# Patient Record
Sex: Male | Born: 1955 | Race: White | Hispanic: No | Marital: Married | Smoking: Never smoker
Health system: Southern US, Community
[De-identification: ages and names within clinical notes are randomized; demographics above are authoritative.]

## PROBLEM LIST (undated history)

## (undated) HISTORY — PX: MELANOMA EXCISION: SHX5266

## (undated) HISTORY — PX: BASAL CELL CARCINOMA EXCISION: SHX1214

---

## 2010-06-25 ENCOUNTER — Encounter (INDEPENDENT_AMBULATORY_CARE_PROVIDER_SITE_OTHER): Payer: Self-pay | Admitting: *Deleted

## 2010-06-25 ENCOUNTER — Ambulatory Visit: Payer: Self-pay | Admitting: Internal Medicine

## 2010-06-25 DIAGNOSIS — I10 Essential (primary) hypertension: Secondary | ICD-10-CM | POA: Insufficient documentation

## 2010-06-25 DIAGNOSIS — R030 Elevated blood-pressure reading, without diagnosis of hypertension: Secondary | ICD-10-CM | POA: Insufficient documentation

## 2010-06-26 LAB — CONVERTED CEMR LAB
AST: 31 units/L (ref 0–37)
Albumin: 4.6 g/dL (ref 3.5–5.2)
Alkaline Phosphatase: 52 units/L (ref 39–117)
Basophils Relative: 0.6 % (ref 0.0–3.0)
Chloride: 100 meq/L (ref 96–112)
GFR calc non Af Amer: 74.64 mL/min (ref >60)
Hemoglobin: 14.4 g/dL (ref 13.0–17.0)
Lymphocytes Relative: 17.7 % (ref 12.0–46.0)
Monocytes Relative: 6.8 % (ref 3.0–12.0)
Neutro Abs: 4.4 10*3/uL (ref 1.4–7.7)
Phosphorus: 3.4 mg/dL (ref 2.3–4.6)
Potassium: 3.9 meq/L (ref 3.5–5.1)
RBC: 4.53 M/uL (ref 4.22–5.81)
TSH: 1.36 microintl units/mL (ref 0.35–5.50)
Total Protein: 7.7 g/dL (ref 6.0–8.3)
VLDL: 16.8 mg/dL (ref 0.0–40.0)

## 2010-07-25 ENCOUNTER — Encounter (INDEPENDENT_AMBULATORY_CARE_PROVIDER_SITE_OTHER): Payer: Self-pay

## 2010-07-29 ENCOUNTER — Ambulatory Visit: Payer: Self-pay | Admitting: Gastroenterology

## 2010-08-11 ENCOUNTER — Ambulatory Visit (HOSPITAL_COMMUNITY): Admission: RE | Admit: 2010-08-11 | Discharge: 2010-08-11 | Payer: Self-pay | Admitting: Gastroenterology

## 2010-08-11 ENCOUNTER — Ambulatory Visit: Payer: Self-pay | Admitting: Gastroenterology

## 2010-08-11 DIAGNOSIS — K56609 Unspecified intestinal obstruction, unspecified as to partial versus complete obstruction: Secondary | ICD-10-CM | POA: Insufficient documentation

## 2010-08-11 LAB — HM COLONOSCOPY

## 2010-08-19 ENCOUNTER — Ambulatory Visit: Payer: Self-pay | Admitting: Internal Medicine

## 2010-08-19 DIAGNOSIS — R7309 Other abnormal glucose: Secondary | ICD-10-CM | POA: Insufficient documentation

## 2010-08-20 LAB — CONVERTED CEMR LAB: Hgb A1c MFr Bld: 6 % (ref 4.6–6.5)

## 2010-10-28 NOTE — Letter (Signed)
Summary: [facility] Instructions  [facility]  [address] [place], [place] [address]   Phone: [phone]  Fax: [phone]       OSCEOLA DEPAZ    1956/08/12    MRN: [id number]        Procedure Day /Date:  Monday 08/11/2010     Arrival Time: 9:00 am      Procedure Time: 10:00 am     Location of Procedure:                    _x _  [facility] (4th Floor)                        PREPARATION FOR COLONOSCOPY WITH MOVIPREP   Starting 5 days prior to your procedure Wednesday 11/9 do not eat nuts, seeds, popcorn, corn, beans, peas,  salads, or any raw vegetables.  Do not take any fiber supplements (e.g. Metamucil, Citrucel, and Benefiber).  THE DAY BEFORE YOUR PROCEDURE         DATE: Sunday 11/13  1.  Drink clear liquids the entire day-NO SOLID FOOD  2.  Do not drink anything colored red or purple.  Avoid juices with pulp.  No orange juice.  3.  Drink at least 64 oz. (8 glasses) of fluid/clear liquids during the day to prevent dehydration and help the prep work efficiently.  CLEAR LIQUIDS INCLUDE: Water Jello Ice Popsicles Tea (sugar ok, no milk/cream) Powdered fruit flavored drinks Coffee (sugar ok, no milk/cream) Gatorade Juice: apple, white grape, white cranberry  Lemonade Clear bullion, consomm, broth Carbonated beverages (any kind) Strained chicken noodle soup Hard Candy                             4.  In the morning, mix first dose of MoviPrep solution:    Empty 1 Pouch A and 1 Pouch B into the disposable container    Add lukewarm drinking water to the top line of the container. Mix to dissolve    Refrigerate (mixed solution should be used within 24 hrs)  5.  Begin drinking the prep at 5:00 p.m. The MoviPrep container is divided by 4 marks.   Every 15 minutes drink the solution down to the next mark (approximately 8 oz) until the full liter is complete.   6.  Follow completed prep with 16 oz of clear liquid of your choice  (Nothing red or purple).  Continue to drink clear liquids until bedtime.  7.  Before going to bed, mix second dose of MoviPrep solution:    Empty 1 Pouch A and 1 Pouch B into the disposable container    Add lukewarm drinking water to the top line of the container. Mix to dissolve    Refrigerate  THE DAY OF YOUR PROCEDURE      DATE: Monday 11/14  Beginning at 5:00 a.m. (5 hours before procedure):         1. Every 15 minutes, drink the solution down to the next mark (approx 8 oz) until the full liter is complete.  2. Follow completed prep with 16 oz. of clear liquid of your choice.    3. You may drink clear liquids until 8:00 am (2 HOURS BEFORE PROCEDURE).   MEDICATION INSTRUCTIONS  Unless otherwise instructed, you should take regular prescription medications with a small sip of water   as early as possible the morning of  your procedure.       OTHER INSTRUCTIONS  You will need a responsible adult at least 57 years of age to accompany you and drive you home.   This person must remain in the waiting room during your procedure.  Wear loose fitting clothing that is easily removed.  Leave jewelry and other valuables at home.  However, you may wish to bring a book to read or  an iPod/MP3 player to listen to music as you wait for your procedure to start.  Remove all body piercing jewelry and leave at home.  Total time from sign-in until discharge is approximately 2-3 hours.  You should go home directly after your procedure and rest.  You can resume normal activities the  day after your procedure.  The day of your procedure you should not:   Drive   Make legal decisions   Operate machinery   Drink alcohol   Return to work  You will receive specific instructions about eating, activities and medications before you leave.    The above instructions have been reviewed and explained to me by   Ulis Rias RN  July 29, 2010 5:06 pm    I fully understand and can  verbalize these instructions _____________________________ Date _________

## 2010-10-28 NOTE — Letter (Signed)
Summary: Pre Visit Letter Revised  [facility]  [address] [place], [place] [address]   Phone: [phone]  Fax: [phone]        06/25/2010 MRN: [id number] Clarence Kirk [address] [place], [place]  [address]             Procedure Date:  08-11-10   Welcome to the Gastroenterology Division at [facility]    You are scheduled to see a nurse for your pre-procedure visit on 07-29-10 at 4:30p.m. on the 3rd floor at [facility], [address]  We ask that you try to arrive at our office 15 minutes prior to your appointment time to allow for check-in.  Please take a minute to review the attached form.  If you answer "Yes" to one or more of the questions on the first page, we ask that you call the person listed at your earliest opportunity.  If you answer "No" to all of the questions, please complete the rest of the form and bring it to your appointment.    Your nurse visit will consist of discussing your medical and surgical history, your immediate family medical history, and your medications.   If you are unable to list all of your medications on the form, please bring the medication bottles to your appointment and we will list them.  We will need to be aware of both prescribed and over the counter drugs.  We will need to know exact dosage information as well.    Please be prepared to read and sign documents such as consent forms, a financial agreement, and acknowledgement forms.  If necessary, and with your consent, a friend or relative is welcome to sit-in on the nurse visit with you.  Please bring your insurance card so that we may make a copy of it.  If your insurance requires a referral to see a specialist, please bring your referral form from your primary care physician.  No co-pay is required for this nurse visit.     If you cannot keep your appointment, please call [phone] to cancel or reschedule prior to your appointment date.  This  allows [place] the opportunity to schedule an appointment for another patient in need of care.    Thank you for choosing [facility] for your medical needs.  We appreciate the opportunity to care for you.  Please visit [place] at our website  to learn more about our practice.  Sincerely, The Gastroenterology Division

## 2010-10-28 NOTE — Procedures (Signed)
Summary: Colonoscopy  Patient: [name] Note: All result statuses are Final unless otherwise noted.  Tests: (1) Colonoscopy (COL)   COL Colonoscopy           DONE (C)     [facility]     [address]     [place], [place]  [address]           COLONOSCOPY PROCEDURE REPORT           PATIENT:  Clarence Kirk, Clarence Kirk  MR#:  [id number]     BIRTHDATE:  01/27/1956, 54 yrs. old  GENDER:  male     ENDOSCOPIST:  Vania Rea. Jarold Motto, MD, [facility]     REF. BY:  Tillman Abide, M.D.     PROCEDURE DATE:  08/11/2010     PROCEDURE:  Incomplete colonoscopy     ASA CLASS:  Class I     INDICATIONS:  screening     MEDICATIONS:   Fentanyl 100 mcg IV, Versed 10 mg IV, Benadryl 50     mg           DESCRIPTION OF PROCEDURE:   After the risks benefits and     alternatives of the procedure were thoroughly explained, informed     consent was obtained.  Digital rectal exam was performed and     revealed no abnormalities.   The LB 180AL [id number] and LB     PCF-Q180AL [id number] endoscope was introduced through the anus and     advanced to the sigmoid colon, limited by stenosis.  COULD NOT     PAASS PROXIMAL SIGMOID AREA.EVEN TRIED PEDIATRIC SCOPE.??     ANATOMICAL VARIATION.NO MASS LESION NOTED.  The quality of the     prep was excellent, using MoviPrep.  The instrument was then     slowly withdrawn as the colon was fully examined.           FINDINGS:  Mild diverticulosis was found.  no active bleeding or     blood in c.  incomplete exam.   Retroflexed views in the rectum     revealed no abnormalities.    The scope was then withdrawn from     the patient and the procedure completed.           COMPLICATIONS:  None     ENDOSCOPIC IMPRESSION:     1) Mild diverticulosis     2) No active bleeding or blood in c     3) Incomplete exam     RECOMMENDATIONS:     BARIUM ENEMA TODAY.     REPEAT EXAM:  No           ______________________________     Vania Rea. Jarold Motto, MD, [facility]           CC:           n.    REVISED:  08/11/2010 12:39 PM     eSIGNED:   Vania Rea. Patterson at 08/11/2010 12:39 PM           Annett Gula, [id number]  Note: An exclamation mark (!) indicates a result that was not dispersed into the flowsheet. Document Creation Date: 08/11/2010 12:40 PM _______________________________________________________________________  (1) Order result status: Final Collection or observation date-time: 08/11/2010 10:39 Requested date-time:  Receipt date-time:  Reported date-time:  Referring Physician:   Ordering Physician: Sheryn Bison [phone]) Specimen Source:  Source: Launa Grill Order Number: [id number] Lab site:

## 2010-10-28 NOTE — Miscellaneous (Signed)
Summary: Orders Update  Clinical Lists Changes  Problems: Added new problem of UNSPECIFIED INTESTINAL OBSTRUCTION (ICD-560.9) Orders: Added new Test order of Barium Enema (BE) - Signed

## 2010-10-28 NOTE — Assessment & Plan Note (Signed)
Summary: NEW PT TO ESTABH/DLO   Vital Signs:  Patient profile:   55 year old male Height:      66.5 inches Weight:      179 pounds BMI:     28.56 Temp:     98.4 degrees F oral Pulse rate:   96 / minute Pulse rhythm:   regular BP sitting:   180 / 100  (left arm) Cuff size:   large  Vitals Entered By: Mervin Hack CMA Duncan Dull) (June 25, 2010 11:38 AM) CC: new patient to establish care   History of Present Illness: Here to establish Has always had a phobia about doctor's offices--had a bad experience in past WIfe has insisted that he get checked  Always has white coat hypertension 125/70 when he checks it himself at home works out [employer] 6 days per week--- cardio and other cross training No changes in exercise tolerance Eats very healthy diet    Preventive Screening-Counseling & Management  Alcohol-Tobacco     Smoking Status: never  Allergies (verified): No Known Drug Allergies  Past History:  Past Medical History: Melanoma  -----------------------------------Dr Yetta Barre Basal cell carcinomas  Family History: Dad has HTN Mom with colon cancer and some stomach problems 1 sister Mat GF had CAD  No DM No prostate cancer  Social History: Occupation:  Owns Advertising account planner Married--- son and daughter Never Smoked Alcohol use-yes Occupation:  employed Smoking Status:  never  Review of Systems General:  weight up a little in the past year --has been doing some upper body weight work sleeps well wears seat belt. Eyes:  Denies double vision and vision loss-1 eye. ENT:  Denies decreased hearing and ringing in ears; teeth okay--regular with dentist. CV:  Denies chest pain or discomfort, difficulty breathing at night, difficulty breathing while lying down, fainting, lightheadness, palpitations, and shortness of breath with exertion. Resp:  Denies cough and shortness of breath. GI:  Denies abdominal pain, bloody stools, change in bowel habits, dark  tarry stools, indigestion, nausea, and vomiting. GU:  Denies erectile dysfunction, urinary frequency, and urinary hesitancy; rare nocturia. MS:  Denies joint pain and joint swelling. Derm:  Denies lesion(s) and rash; gets regular derm evals. Neuro:  Denies headaches, numbness, tingling, and weakness. Psych:  Denies anxiety and depression. Endo:  Denies cold intolerance and heat intolerance. Heme:  Denies abnormal bruising and enlarge lymph nodes. Allergy:  Denies seasonal allergies and sneezing.  Physical Exam  General:  alert and normal appearance.   Eyes:  pupils equal, pupils round, pupils reactive to light, and no optic disk abnormalities.   Ears:  R ear normal and L ear normal.   Mouth:  no erythema, no exudates, and no lesions.   Neck:  supple, no masses, no thyromegaly, no carotid bruits, and no cervical lymphadenopathy.   Lungs:  normal respiratory effort, no intercostal retractions, no accessory muscle use, and normal breath sounds.   Heart:  normal rate, regular rhythm, no murmur, and no gallop.   Abdomen:  soft, non-tender, and no masses.   Rectal:  no masses.   Small non- inflamed hemorrhoid Prostate:  no gland enlargement and no nodules.   Msk:  no joint tenderness and no joint swelling.   Pulses:  normal in feet Extremities:  no edema Neurologic:  alert & oriented X3, strength normal in all extremities, and gait normal.   Skin:  no rashes and no suspicious lesions.   Axillary Nodes:  No palpable lymphadenopathy Psych:  normally interactive, good eye  contact, not anxious appearing, and not depressed appearing.     Impression & Recommendations:  Problem # 1:  PREVENTIVE HEALTH CARE (ICD-V70.0) Assessment Comment Only healthy very fit will check PSA Colonoscopy Tdap  Problem # 2:  ELEVATED BLOOD PRESSURE WITHOUT DIAGNOSIS OF HYPERTENSION (ICD-796.2) Assessment: New  He checks and is normal at other times will just check labs and urine microal  he will  monitor  Orders: Venipuncture (04540) TLB-Lipid Panel (80061-LIPID) TLB-Renal Function Panel (80069-RENAL) TLB-CBC Platelet - w/Differential (85025-CBCD) TLB-Hepatic/Liver Function Pnl (80076-HEPATIC) TLB-TSH (Thyroid Stimulating Hormone) (84443-TSH) TLB-Microalbumin/Creat Ratio, Urine (82043-MALB)  Other Orders: TLB-PSA (Prostate Specific Antigen) (84153-PSA) Gastroenterology Referral (GI) Tdap => 33yrs IM (98119) Admin 1st Vaccine (14782) Admin 1st Vaccine Horticulturist, commercial) [phone])  Patient Instructions: 1)  Please schedule a follow-up appointment in 1-2 years  Prior Medications: None Current Allergies (reviewed today): No known allergies    Tetanus/Td Vaccine    Vaccine Type: Tdap    Site: right deltoid    Mfr: GlaxoSmithKline    Dose: 0.5 ml    Route: IM    Given by: Sydell Axon LPN    Exp. Date: 07/17/2012    Lot #: [id number]    VIS given: 08/15/08 version given June 25, 2010.

## 2010-10-28 NOTE — Miscellaneous (Signed)
Summary: Lec previsit  Clinical Lists Changes  Medications: Added new medication of MOVIPREP 100 GM  SOLR (PEG-KCL-NACL-NASULF-NA ASC-C) As per prep instructions. - Signed Rx of MOVIPREP 100 GM  SOLR (PEG-KCL-NACL-NASULF-NA ASC-C) As per prep instructions.;  #1 x 0;  Signed;  Entered by: Ulis Rias RN;  Authorized by: Mardella Layman MD [facility];  Method used: [facility]  [id number]*, [address] [place], [place]  [address], Ph: [phone] or [phone], Fax: [phone] Allergies: Added new allergy or adverse reaction of NEOSPORIN Observations: Added new observation of NKA: F (07/29/2010 16:24)    Prescriptions: MOVIPREP 100 GM  SOLR (PEG-KCL-NACL-NASULF-NA ASC-C) As per prep instructions.  #1 x 0   Entered by:   Ulis Rias RN   Authorized by:   Mardella Layman MD [facility]   Signed by:   Ulis Rias RN on 07/29/2010   Method used:   [facility] to        [facility]  [phone]* (retail)       [address] [place], [place]  [address]       Ph: [phone] or [phone]       Fax: [phone]   RxID:   [id number]

## 2015-09-25 ENCOUNTER — Ambulatory Visit: Payer: Self-pay | Admitting: Internal Medicine

## 2016-02-19 ENCOUNTER — Ambulatory Visit (INDEPENDENT_AMBULATORY_CARE_PROVIDER_SITE_OTHER): Payer: BLUE CROSS/BLUE SHIELD | Admitting: Internal Medicine

## 2016-02-19 ENCOUNTER — Encounter: Payer: Self-pay | Admitting: Internal Medicine

## 2016-02-19 ENCOUNTER — Encounter (INDEPENDENT_AMBULATORY_CARE_PROVIDER_SITE_OTHER): Payer: Self-pay

## 2016-02-19 VITALS — BP 194/110 | HR 91 | Temp 98.3°F | Ht 67.0 in | Wt 172.0 lb

## 2016-02-19 DIAGNOSIS — Z125 Encounter for screening for malignant neoplasm of prostate: Secondary | ICD-10-CM | POA: Diagnosis not present

## 2016-02-19 DIAGNOSIS — R030 Elevated blood-pressure reading, without diagnosis of hypertension: Secondary | ICD-10-CM

## 2016-02-19 DIAGNOSIS — Z0000 Encounter for general adult medical examination without abnormal findings: Secondary | ICD-10-CM

## 2016-02-19 LAB — CBC WITH DIFFERENTIAL/PLATELET
BASOS ABS: 0 10*3/uL (ref 0.0–0.1)
Basophils Relative: 0.5 % (ref 0.0–3.0)
EOS ABS: 0.1 10*3/uL (ref 0.0–0.7)
Eosinophils Relative: 0.9 % (ref 0.0–5.0)
HCT: 42.5 % (ref 39.0–52.0)
Hemoglobin: 14.3 g/dL (ref 13.0–17.0)
LYMPHS ABS: 1 10*3/uL (ref 0.7–4.0)
Lymphocytes Relative: 16.1 % (ref 12.0–46.0)
MCHC: 33.7 g/dL (ref 30.0–36.0)
MCV: 92.9 fl (ref 78.0–100.0)
MONO ABS: 0.5 10*3/uL (ref 0.1–1.0)
MONOS PCT: 7.9 % (ref 3.0–12.0)
NEUTROS ABS: 4.6 10*3/uL (ref 1.4–7.7)
NEUTROS PCT: 74.6 % (ref 43.0–77.0)
PLATELETS: 226 10*3/uL (ref 150.0–400.0)
RBC: 4.57 Mil/uL (ref 4.22–5.81)
RDW: 14.6 % (ref 11.5–15.5)
WBC: 6.2 10*3/uL (ref 4.0–10.5)

## 2016-02-19 LAB — COMPREHENSIVE METABOLIC PANEL
ALK PHOS: 42 U/L (ref 39–117)
ALT: 20 U/L (ref 0–53)
AST: 25 U/L (ref 0–37)
Albumin: 4.7 g/dL (ref 3.5–5.2)
BILIRUBIN TOTAL: 0.8 mg/dL (ref 0.2–1.2)
BUN: 18 mg/dL (ref 6–23)
CO2: 30 meq/L (ref 19–32)
Calcium: 10.1 mg/dL (ref 8.4–10.5)
Chloride: 100 mEq/L (ref 96–112)
Creatinine, Ser: 1.01 mg/dL (ref 0.40–1.50)
GFR: 79.9 mL/min (ref >60.00)
GLUCOSE: 135 mg/dL — AB (ref 70–99)
Potassium: 4.3 mEq/L (ref 3.5–5.1)
SODIUM: 138 meq/L (ref 135–145)
TOTAL PROTEIN: 7.1 g/dL (ref 6.0–8.3)

## 2016-02-19 LAB — T4, FREE: FREE T4: 0.82 ng/dL (ref 0.60–1.60)

## 2016-02-19 LAB — PSA: PSA: 0.48 ng/mL (ref 0.10–4.00)

## 2016-02-19 LAB — MICROALBUMIN / CREATININE URINE RATIO
Creatinine,U: 95.3 mg/dL
MICROALB/CREAT RATIO: 1.8 mg/g (ref 0.0–30.0)
Microalb, Ur: 1.7 mg/dL (ref 0.0–1.9)

## 2016-02-19 MED ORDER — ZOSTER VACCINE LIVE 19400 UNT/0.65ML ~~LOC~~ SUSR: 0.6500 mL | Freq: Once | SUBCUTANEOUS | Status: DC

## 2016-02-19 NOTE — Progress Notes (Signed)
Pre visit review using our clinic review tool, if applicable. No additional management support is needed unless otherwise documented below in the visit note. 

## 2016-02-19 NOTE — Assessment & Plan Note (Signed)
Healthy Will check PSA Colon due 2021 Prefers no flu shot Rx for zostavax

## 2016-02-19 NOTE — Progress Notes (Signed)
Subjective:    Patient ID: Clarence Kirk, male    DOB: 02/20/1956, 60 y.o.   MRN: [id number]  HPI Here to establish Hasn't seen any doctors in years  He has no concerns Always has had white coat hypertension He checked this morning 127/8---automated arm cuff He exercises regularly Weight is stable  Healthy other than skin cancers Sees Dr Yetta BarreJones regularly Melanoma in past Multiple BCCs  No current outpatient prescriptions on file prior to visit.   No current facility-administered medications on file prior to visit.    Allergies  Allergen Reactions  . Neomycin-Bacitracin Zn-Polymyx     REACTION: rash    No past medical history on file.  Past Surgical History  Procedure Laterality Date  . Melanoma excision  ~2009    back   . Basal cell carcinoma excision      multiple    Family History  Problem Relation Age of Onset  . Heart disease Mother   . Heart disease Maternal Grandfather   . Diabetes Neg Hx   . Cancer Neg Hx     Social History   Social History  . Marital Status: Married    Spouse Name: N/A  . Number of Children: 2  . Years of Education: N/A   Occupational History  . 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 Smoking status: Never Smoker   . Smokeless tobacco: Not on file  . Alcohol Use: 0.0 oz/week    0 Standard drinks or equivalent per week     Comment: enjoys red wine  . Drug Use: Not on file  . Sexual Activity: Not on file   Other Topics Concern  . Not on file   Social History Narrative  . No narrative on file   Review of Systems  Constitutional: Negative for fatigue and unexpected weight change.       Wears seat belt  HENT: Negative for dental problem, tinnitus and trouble swallowing.        Mild hearing problems with background noise Keeps up with dentist  Eyes: Negative for visual disturbance.  Respiratory: Negative for cough, chest tightness and shortness of breath.   Cardiovascular: Negative for chest pain,  palpitations and leg swelling.  Gastrointestinal: Negative for nausea, vomiting, abdominal pain, constipation and blood in stool.  Endocrine: Negative for polydipsia and polyuria.  Genitourinary: Negative for urgency and difficulty urinating.       No sexual problems  Musculoskeletal: Negative for back pain, joint swelling and arthralgias.  Skin: Negative for rash.  Allergic/Immunologic: Negative for environmental allergies and immunocompromised state.  Neurological: Negative for dizziness, syncope, weakness, light-headedness and headaches.  Psychiatric/Behavioral: Positive for sleep disturbance. Negative for dysphoric mood. The patient is not nervous/anxious.        Does have awakening at night--mind will race at times        Objective:   Physical Exam  Constitutional: He is oriented to person, place, and time. He appears well-developed and well-nourished. No distress.  HENT:  Head: Normocephalic and atraumatic.  Right Ear: External ear normal.  Left Ear: External ear normal.  Mouth/Throat: Oropharynx is clear and moist. No oropharyngeal exudate.  Eyes: Conjunctivae are normal. Pupils are equal, round, and reactive to light.  Neck: Normal range of motion. Neck supple. No thyromegaly present.  Cardiovascular: Normal rate, regular rhythm, normal heart sounds and intact distal pulses.  Exam reveals no gallop.   No murmur heard. Pulmonary/Chest: Effort normal and breath sounds normal. No respiratory  distress. He has no wheezes. He has no rales.  Abdominal: Soft. There is no tenderness.  Musculoskeletal: He exhibits no edema or tenderness.  Lymphadenopathy:    He has no cervical adenopathy.  Neurological: He is alert and oriented to person, place, and time.  Skin: No rash noted. No erythema.  Psychiatric: He has a normal mood and affect. His behavior is normal.          Assessment & Plan:

## 2016-02-19 NOTE — Assessment & Plan Note (Signed)
Generally fine at home, etc Asked him to check multiple sites---like at Y and pharmacy--to confirm it is usually normal

## 2016-02-26 ENCOUNTER — Other Ambulatory Visit (INDEPENDENT_AMBULATORY_CARE_PROVIDER_SITE_OTHER): Payer: BLUE CROSS/BLUE SHIELD

## 2016-02-26 DIAGNOSIS — R7309 Other abnormal glucose: Secondary | ICD-10-CM | POA: Diagnosis not present

## 2016-02-26 DIAGNOSIS — R739 Hyperglycemia, unspecified: Secondary | ICD-10-CM

## 2016-02-26 LAB — GLUCOSE, RANDOM: Glucose, Bld: 110 mg/dL — ABNORMAL HIGH (ref 70–99)

## 2017-02-05 DIAGNOSIS — H2513 Age-related nuclear cataract, bilateral: Secondary | ICD-10-CM | POA: Diagnosis not present

## 2017-10-19 ENCOUNTER — Telehealth: Payer: Self-pay | Admitting: Internal Medicine

## 2017-10-19 ENCOUNTER — Other Ambulatory Visit: Payer: Self-pay | Admitting: Internal Medicine

## 2017-10-19 DIAGNOSIS — R7309 Other abnormal glucose: Secondary | ICD-10-CM

## 2017-10-19 DIAGNOSIS — Z125 Encounter for screening for malignant neoplasm of prostate: Secondary | ICD-10-CM

## 2017-10-19 NOTE — Telephone Encounter (Signed)
Carollee HerterShannon Can this patient be worked in.  The next cpx I see is 5/2  Copied from CRM [phone]#[id number]. Topic: Appointment Scheduling - Scheduling Inquiry for Clinic >> Oct 19, 2017 11:56 AM Diana EvesHoyt, Maryann B wrote: Reason for CRM: pt would like to set up cpe and labs in March. I didn't see any slots that wasn't right behind another cpe

## 2017-10-19 NOTE — Telephone Encounter (Signed)
Do you mind if we have back to back CPE?

## 2017-10-20 NOTE — Telephone Encounter (Signed)
I have said many times that I expect back to back PE/AMW---I have too many of them not to do that. I am okay with a true open schedule

## 2017-10-20 NOTE — Telephone Encounter (Signed)
Appointment 3/11 pt aware

## 2017-10-25 ENCOUNTER — Other Ambulatory Visit: Payer: BLUE CROSS/BLUE SHIELD

## 2017-10-25 ENCOUNTER — Encounter: Payer: BLUE CROSS/BLUE SHIELD | Admitting: Internal Medicine

## 2017-12-01 ENCOUNTER — Telehealth: Payer: Self-pay | Admitting: Family Medicine

## 2017-12-01 NOTE — Telephone Encounter (Signed)
Labs already ordered by PCP.

## 2017-12-02 ENCOUNTER — Other Ambulatory Visit (INDEPENDENT_AMBULATORY_CARE_PROVIDER_SITE_OTHER): Payer: 59

## 2017-12-02 DIAGNOSIS — Z125 Encounter for screening for malignant neoplasm of prostate: Secondary | ICD-10-CM

## 2017-12-02 DIAGNOSIS — R030 Elevated blood-pressure reading, without diagnosis of hypertension: Secondary | ICD-10-CM | POA: Diagnosis not present

## 2017-12-02 DIAGNOSIS — R7309 Other abnormal glucose: Secondary | ICD-10-CM | POA: Diagnosis not present

## 2017-12-02 LAB — COMPREHENSIVE METABOLIC PANEL
ALBUMIN: 4.6 g/dL (ref 3.5–5.2)
ALT: 17 U/L (ref 0–53)
AST: 23 U/L (ref 0–37)
Alkaline Phosphatase: 46 U/L (ref 39–117)
BUN: 14 mg/dL (ref 6–23)
CHLORIDE: 99 meq/L (ref 96–112)
CO2: 29 meq/L (ref 19–32)
CREATININE: 1.05 mg/dL (ref 0.40–1.50)
Calcium: 10 mg/dL (ref 8.4–10.5)
GFR: 75.95 mL/min (ref >60.00)
GLUCOSE: 111 mg/dL — AB (ref 70–99)
POTASSIUM: 4 meq/L (ref 3.5–5.1)
SODIUM: 137 meq/L (ref 135–145)
Total Bilirubin: 0.9 mg/dL (ref 0.2–1.2)
Total Protein: 7.4 g/dL (ref 6.0–8.3)

## 2017-12-02 LAB — LIPID PANEL
CHOL/HDL RATIO: 3
Cholesterol: 222 mg/dL — ABNORMAL HIGH (ref 0–200)
HDL: 82 mg/dL (ref >39.00)
LDL CALC: 122 mg/dL — AB (ref 0–99)
NONHDL: 140.13
Triglycerides: 90 mg/dL (ref 0.0–149.0)
VLDL: 18 mg/dL (ref 0.0–40.0)

## 2017-12-02 LAB — CBC
HCT: 42.5 % (ref 39.0–52.0)
HEMOGLOBIN: 14.6 g/dL (ref 13.0–17.0)
MCHC: 34.3 g/dL (ref 30.0–36.0)
MCV: 95.3 fl (ref 78.0–100.0)
PLATELETS: 247 10*3/uL (ref 150.0–400.0)
RBC: 4.46 Mil/uL (ref 4.22–5.81)
RDW: 13.8 % (ref 11.5–15.5)
WBC: 5.3 10*3/uL (ref 4.0–10.5)

## 2017-12-02 LAB — PSA: PSA: 0.55 ng/mL (ref 0.10–4.00)

## 2017-12-02 LAB — HEMOGLOBIN A1C: HEMOGLOBIN A1C: 5.9 % (ref 4.6–6.5)

## 2017-12-06 ENCOUNTER — Other Ambulatory Visit: Payer: BLUE CROSS/BLUE SHIELD

## 2017-12-06 ENCOUNTER — Encounter: Payer: Self-pay | Admitting: Internal Medicine

## 2017-12-06 ENCOUNTER — Ambulatory Visit (INDEPENDENT_AMBULATORY_CARE_PROVIDER_SITE_OTHER): Payer: 59 | Admitting: Internal Medicine

## 2017-12-06 VITALS — BP 240/90 | HR 107 | Temp 98.1°F | Ht 67.0 in | Wt 177.0 lb

## 2017-12-06 DIAGNOSIS — Z0001 Encounter for general adult medical examination with abnormal findings: Secondary | ICD-10-CM | POA: Diagnosis not present

## 2017-12-06 DIAGNOSIS — R011 Cardiac murmur, unspecified: Secondary | ICD-10-CM | POA: Diagnosis not present

## 2017-12-06 DIAGNOSIS — Z0000 Encounter for general adult medical examination without abnormal findings: Secondary | ICD-10-CM

## 2017-12-06 DIAGNOSIS — R030 Elevated blood-pressure reading, without diagnosis of hypertension: Secondary | ICD-10-CM | POA: Diagnosis not present

## 2017-12-06 LAB — MICROALBUMIN / CREATININE URINE RATIO
CREATININE, U: 247.6 mg/dL
MICROALB UR: 5.9 mg/dL — AB (ref 0.0–1.9)
Microalb Creat Ratio: 2.4 mg/g (ref 0.0–30.0)

## 2017-12-06 NOTE — Assessment & Plan Note (Signed)
Healthy Colon due 2021 PSA just checked  Stays fit Unusual cognitive issues--but doesn't seem pre dementia

## 2017-12-06 NOTE — Assessment & Plan Note (Signed)
BP Readings from Last 3 Encounters:  12/06/17 (!) 240/90  02/19/16 (!) 194/110   Always very high  Never high at home with regular tests Will check urine microal and echo (due to murmur) and to make sure no wall thickening, etc

## 2017-12-06 NOTE — Progress Notes (Signed)
Subjective:    Patient ID: Clarence Kirk, male    DOB: 04/08/1956, 62 y.o.   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 Glucosamine-Chondroitin (GLUCOSAMINE CHONDR COMPLEX PO) Take 1 tablet by mouth.    . Multiple Vitamin (MULTIVITAMIN) capsule Take 1 capsule by mouth daily.    . Omega-3 Fatty Acids (FISH OIL) 1000 MG CAPS Take 1 capsule by mouth.     No current facility-administered medications on file prior to visit.     Allergies  Allergen Reactions  . Neomycin-Bacitracin Zn-Polymyx     REACTION: rash    History reviewed. No pertinent past medical history.  Past Surgical History:  Procedure Laterality Date  . BASAL CELL CARCINOMA EXCISION     multiple  . MELANOMA EXCISION  ~2009   back     Family History  Problem Relation Age of Onset  . Heart disease Mother   . Heart disease Maternal Grandfather   . Diabetes Neg Hx   . Cancer Neg Hx     Social History   Socioeconomic History  . Marital status: Married    Spouse name: Not on file  . Number of children: 2  . Years of education: Not on file  . Highest education level: Not on file  Social Needs  . Financial resource strain: Not on file  . Food insecurity - worry: Not on file  . Food insecurity - inability: Not on file  . Transportation  needs - medical: Not on file  . Transportation needs - non-medical: Not on file  Occupational History  . 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 Smoking status: Never Smoker  . Smokeless tobacco: Never Used  Substance and Sexual Activity  . Alcohol use: Yes    Alcohol/week: 0.0 oz    Comment: enjoys red wine  . Drug use: Not on file  . Sexual activity: Not on file  Other Topics Concern  . Not on file  Social History Narrative  . Not on file   Review of Systems  Constitutional: Negative for fatigue and unexpected weight change.       Wears seat belt  HENT: Negative for dental problem, hearing loss and tinnitus.        Keeps up with dentist  Eyes: Negative for visual disturbance.       No diplopia or unilateral vision loss  Respiratory: Negative for cough, chest tightness and shortness of breath.   Cardiovascular: Negative for chest pain, palpitations and leg swelling.  Gastrointestinal: Negative for blood in stool, constipation and nausea.       No heartburn  Endocrine: Negative for polydipsia and polyuria.  Musculoskeletal: Negative for arthralgias, back pain and joint swelling.  Skin: Negative for rash.       Past skin cancers--and in family. Sees Dr Yetta BarreJones  regularly  Allergic/Immunologic: Negative for environmental allergies and immunocompromised state.  Neurological: Negative for dizziness, syncope, light-headedness and headaches.  Hematological: Negative for adenopathy. Does not bruise/bleed easily.  Psychiatric/Behavioral: Positive for sleep disturbance. Negative for dysphoric mood. The patient is nervous/anxious.        Objective:   Physical Exam  Constitutional: He appears well-developed and well-nourished. No distress.  HENT:  Head: Normocephalic and atraumatic.  Right Ear: External ear normal.  Left Ear: External ear normal.  Mouth/Throat: No oropharyngeal exudate.  Eyes: Conjunctivae are normal. Pupils are equal, round, and reactive to light.  Neck:  No thyromegaly present.  Cardiovascular: Normal rate, regular rhythm and intact distal pulses. Exam reveals no gallop.  Soft systolic murmur at base  Pulmonary/Chest: Effort normal and breath sounds normal. No respiratory distress. He has no wheezes. He has no rales.  Abdominal: Soft. He exhibits no distension. There is no tenderness. There is no rebound and no guarding.  Musculoskeletal: He exhibits no edema or tenderness.  Lymphadenopathy:    He has no cervical adenopathy.  Neurological:  No true tremor. Quivering in feet/abdomen ---due to his nerves  Skin: No rash noted. No erythema.  Psychiatric: He has a normal mood and affect. His behavior is normal.          Assessment & Plan:

## 2017-12-23 ENCOUNTER — Other Ambulatory Visit: Payer: 59

## 2018-01-06 ENCOUNTER — Ambulatory Visit (INDEPENDENT_AMBULATORY_CARE_PROVIDER_SITE_OTHER): Payer: 59

## 2018-01-06 ENCOUNTER — Other Ambulatory Visit: Payer: Self-pay

## 2018-01-06 DIAGNOSIS — R011 Cardiac murmur, unspecified: Secondary | ICD-10-CM

## 2018-01-19 ENCOUNTER — Telehealth: Payer: Self-pay | Admitting: Cardiovascular Disease

## 2018-01-19 NOTE — Telephone Encounter (Signed)
Reviewed with patient that results went to Dr. Karle StarchLetvak's office but preliminary results reviewed with him. He verbalized understanding and was appreciative for the call back with no further questions at this time.

## 2018-01-19 NOTE — Telephone Encounter (Signed)
Patient calling Had an ECHO on 4/11 and would like to check on status of when results would be ready Please call

## 2018-02-10 DIAGNOSIS — H2513 Age-related nuclear cataract, bilateral: Secondary | ICD-10-CM | POA: Diagnosis not present

## 2018-12-12 ENCOUNTER — Encounter: Payer: 59 | Admitting: Internal Medicine

## 2019-02-13 DIAGNOSIS — H2513 Age-related nuclear cataract, bilateral: Secondary | ICD-10-CM | POA: Diagnosis not present

## 2019-11-19 ENCOUNTER — Ambulatory Visit: Payer: BC Managed Care – PPO | Attending: Internal Medicine

## 2019-11-19 DIAGNOSIS — Z23 Encounter for immunization: Secondary | ICD-10-CM | POA: Insufficient documentation

## 2019-11-19 NOTE — Progress Notes (Signed)
   Covid-19 Vaccination Clinic  Name:  Clarence Kirk    MRN: [id number] DOB: 09/26/1956  11/19/2019  Clarence Kirk was observed post Covid-19 immunization for 15 minutes without incidence. He was provided with Vaccine Information Sheet and instruction to access the V-Safe system.   Clarence Kirk was instructed to call 911 with any severe reactions post vaccine: Marland Kitchen Difficulty breathing  . Swelling of your face and throat  . A fast heartbeat  . A bad rash all over your body  . Dizziness and weakness    Immunizations Administered    Name Date Dose VIS Date Route   Pfizer COVID-19 Vaccine 11/19/2019  8:33 AM 0.3 mL 09/08/2019 Intramuscular   Manufacturer: [employer], [employer]   Lot: [id number]   NDC: [address]-8

## 2019-12-12 ENCOUNTER — Ambulatory Visit: Payer: BC Managed Care – PPO | Attending: Internal Medicine

## 2019-12-12 DIAGNOSIS — Z23 Encounter for immunization: Secondary | ICD-10-CM

## 2019-12-12 NOTE — Progress Notes (Signed)
   Covid-19 Vaccination Clinic  Name:  Clarence Kirk    MRN: [id number] DOB: Apr 25, 1956  12/12/2019  Clarence Kirk was observed post Covid-19 immunization for 15 minutes without incident. He was provided with Vaccine Information Sheet and instruction to access the V-Safe system.   Clarence Kirk was instructed to call 911 with any severe reactions post vaccine: Marland Kitchen Difficulty breathing  . Swelling of face and throat  . A fast heartbeat  . A bad rash all over body  . Dizziness and weakness   Immunizations Administered    Name Date Dose VIS Date Route   Pfizer COVID-19 Vaccine 12/12/2019  3:00 PM 0.3 mL 09/08/2019 Intramuscular   Manufacturer: [employer], [employer]   Lot: [id number]   NDC: [address]-0

## 2021-07-09 ENCOUNTER — Telehealth: Payer: Self-pay

## 2021-07-09 NOTE — Telephone Encounter (Signed)
Unable to reach pt by phone; I spoke with pts wife (DPR not signed to give info) I did not give info but pts wife said since spoke with access nurse pt went to [facility] on [address] and received abx and cough med. Mrs Foti said nothing else needed. Pt last saw Dr Alphonsus Sias on 12/06/2017. No future appt scheduled. Sending note to Dr Alphonsus Sias who is out of office and Dr Selena Batten who is in office and [facility] CMA.

## 2021-07-09 NOTE — Telephone Encounter (Signed)
[facility] - Client TELEPHONE ADVICE RECORD AccessNurse Patient Name: MAHONRI SEIDEN Gender: Male DOB: 13-Sep-1956 Age: 65 Y 29 D Return Phone Number: [phone] (Primary) Address: City/ State/ Zip: [place]  [address] Client [facility] - Client Client Site [facility] - Day Physician Tillman Abide- MD Contact Type Call Who Is Calling Patient / Member / Family / Caregiver Call Type Triage / Clinical Caller Name Joyice Faster Relationship To Patient Other Return Phone Number [phone] (Primary) Chief Complaint BREATHING - shortness of breath or sounds breathless Reason for Call Symptomatic / Request for Health Information Initial Comment Caller states, husband had COVID, tested negative. Has a terrible cough and gasping for Breath. Veronica from office called. Translation No Nurse Assessment Nurse: Thurmond Butts, RN, Meriam Sprague Date/Time (Eastern Time): 07/09/2021 8:37:27 AM Confirm and document reason for call. If symptomatic, describe symptoms. ---Caller states her husband has had covid but is negative now. He is gasping for breath at times. He is not gasping for breath now. this has been going on for 2 weeks. He is having coughing fits. Does the patient have any new or worsening symptoms? ---Yes Will a triage be completed? ---Yes Related visit to physician within the last 2 weeks? ---No Does the PT have any chronic conditions? (i.e. diabetes, asthma, this includes High risk factors for pregnancy, etc.) ---No Is this a behavioral health or substance abuse call? ---No Disp. Time Lamount Cohen Time) Disposition Final User 07/09/2021 8:36:33 AM Send to Urgent Marlana Salvage 07/09/2021 8:50:37 AM Clinical Call Yes Thurmond Butts, RN, Meriam Sprague Comments User: Bradly Chris, RN Date/Time Lamount Cohen Time): 07/09/2021 8:50:26 AM PLEASE NOTE: All timestamps contained within this report are represented as  [place] Standard Time. CONFIDENTIALTY NOTICE: This fax transmission is intended only for the addressee. It contains information that is legally privileged, confidential or otherwise protected from use or disclosure. If you are not the intended recipient, you are strictly prohibited from reviewing, disclosing, copying using or disseminating any of this information or taking any action in reliance on or regarding this information. If you have received this fax in error, please notify us immediately by telephone so that we can arrange for its return to [place]. Phone: [phone], Toll-Free: [phone], Fax: [phone] Page: 2 of 2 Call Id: [id number] Comments She is not with her husband. He is at work. I have tried to call him twice. He does not answer and she has texted him to tell him i am calling. He did not answer. Told her i am unable to triage without him being there, but if she feels he is needing immediate assistance call 911 or go to ER . She says she just wants appointment at office. Told her i am unable to make appointment without triage, but she can of course call back if with him or he can call back

## 2021-07-10 NOTE — Telephone Encounter (Signed)
Left message to call office or send MyChart note to let us know how he is doing.

## 2021-07-14 ENCOUNTER — Encounter (HOSPITAL_COMMUNITY): Payer: Self-pay | Admitting: Emergency Medicine

## 2021-07-14 ENCOUNTER — Emergency Department (HOSPITAL_COMMUNITY)
Admission: EM | Admit: 2021-07-14 | Discharge: 2021-07-15 | Disposition: A | Discharge status: Home / Self Care | Payer: BC Managed Care – PPO | Attending: Emergency Medicine | Admitting: Emergency Medicine

## 2021-07-14 ENCOUNTER — Other Ambulatory Visit: Payer: Self-pay

## 2021-07-14 ENCOUNTER — Emergency Department (HOSPITAL_COMMUNITY): Payer: BC Managed Care – PPO

## 2021-07-14 DIAGNOSIS — S6992XA Unspecified injury of left wrist, hand and finger(s), initial encounter: Secondary | ICD-10-CM | POA: Diagnosis present

## 2021-07-14 DIAGNOSIS — Z8616 Personal history of COVID-19: Secondary | ICD-10-CM | POA: Diagnosis not present

## 2021-07-14 DIAGNOSIS — R55 Syncope and collapse: Secondary | ICD-10-CM

## 2021-07-14 DIAGNOSIS — R410 Disorientation, unspecified: Secondary | ICD-10-CM | POA: Diagnosis not present

## 2021-07-14 DIAGNOSIS — Y92000 Kitchen of unspecified non-institutional (private) residence as  the place of occurrence of the external cause: Secondary | ICD-10-CM | POA: Insufficient documentation

## 2021-07-14 DIAGNOSIS — Z23 Encounter for immunization: Secondary | ICD-10-CM | POA: Diagnosis not present

## 2021-07-14 DIAGNOSIS — W19XXXA Unspecified fall, initial encounter: Secondary | ICD-10-CM | POA: Diagnosis not present

## 2021-07-14 DIAGNOSIS — S0083XA Contusion of other part of head, initial encounter: Secondary | ICD-10-CM | POA: Insufficient documentation

## 2021-07-14 DIAGNOSIS — S63125A Dislocation of unspecified interphalangeal joint of left thumb, initial encounter: Secondary | ICD-10-CM | POA: Diagnosis not present

## 2021-07-14 DIAGNOSIS — R059 Cough, unspecified: Secondary | ICD-10-CM | POA: Diagnosis not present

## 2021-07-14 DIAGNOSIS — I7121 Aneurysm of the ascending aorta, without rupture: Secondary | ICD-10-CM

## 2021-07-14 DIAGNOSIS — I2510 Atherosclerotic heart disease of native coronary artery without angina pectoris: Secondary | ICD-10-CM | POA: Diagnosis not present

## 2021-07-14 DIAGNOSIS — S01511A Laceration without foreign body of lip, initial encounter: Secondary | ICD-10-CM | POA: Diagnosis not present

## 2021-07-14 DIAGNOSIS — R413 Other amnesia: Secondary | ICD-10-CM | POA: Diagnosis not present

## 2021-07-14 LAB — CBC WITH DIFFERENTIAL/PLATELET
Abs Immature Granulocytes: 0.07 10*3/uL (ref 0.00–0.07)
Basophils Absolute: 0 10*3/uL (ref 0.0–0.1)
Basophils Relative: 0 %
Eosinophils Absolute: 0.1 10*3/uL (ref 0.0–0.5)
Eosinophils Relative: 1 %
HCT: 38.7 % — ABNORMAL LOW (ref 39.0–52.0)
Hemoglobin: 13.3 g/dL (ref 13.0–17.0)
Immature Granulocytes: 1 %
Lymphocytes Relative: 15 %
Lymphs Abs: 1.3 10*3/uL (ref 0.7–4.0)
MCH: 32.6 pg (ref 26.0–34.0)
MCHC: 34.4 g/dL (ref 30.0–36.0)
MCV: 94.9 fL (ref 80.0–100.0)
Monocytes Absolute: 0.7 10*3/uL (ref 0.1–1.0)
Monocytes Relative: 8 %
Neutro Abs: 6.6 10*3/uL (ref 1.7–7.7)
Neutrophils Relative %: 75 %
Platelets: 265 10*3/uL (ref 150–400)
RBC: 4.08 MIL/uL — ABNORMAL LOW (ref 4.22–5.81)
RDW: 11.9 % (ref 11.5–15.5)
WBC: 8.8 10*3/uL (ref 4.0–10.5)
nRBC: 0 % (ref 0.0–0.2)

## 2021-07-14 LAB — COMPREHENSIVE METABOLIC PANEL
ALT: 18 U/L (ref 0–44)
AST: 28 U/L (ref 15–41)
Albumin: 3.7 g/dL (ref 3.5–5.0)
Alkaline Phosphatase: 47 U/L (ref 38–126)
Anion gap: 13 (ref 5–15)
BUN: 20 mg/dL (ref 8–23)
CO2: 23 mmol/L (ref 22–32)
Calcium: 9.5 mg/dL (ref 8.9–10.3)
Chloride: 101 mmol/L (ref 98–111)
Creatinine, Ser: 1.06 mg/dL (ref 0.61–1.24)
GFR, Estimated: >60 mL/min (ref >60)
Glucose, Bld: 115 mg/dL — ABNORMAL HIGH (ref 70–99)
Potassium: 3.7 mmol/L (ref 3.5–5.1)
Sodium: 137 mmol/L (ref 135–145)
Total Bilirubin: 0.4 mg/dL (ref 0.3–1.2)
Total Protein: 7 g/dL (ref 6.5–8.1)

## 2021-07-14 LAB — TROPONIN I (HIGH SENSITIVITY): Troponin I (High Sensitivity): 13 ng/L (ref <18)

## 2021-07-14 NOTE — ED Triage Notes (Addendum)
Per EMS, pt from home, had a unwitnessed syncopal episode w/ a fall that he does not remember.  Pts wife stated he was unresponsive for a minute.  AO X4, stroke screen negative. Hypertensive 202/96.  Repetitive questions, does not remember episode.  No medical hx. No blood thinners  CBG 139 HR 104 98% RA  Denies pain other than lip which does have a wound.

## 2021-07-14 NOTE — ED Provider Notes (Signed)
Emergency Medicine Provider Triage Evaluation Note  Clarence Kirk , a 65 y.o. male  was evaluated in triage.  Pt complains of unwitnessed fall with LOC.  Patient does not remember anything leading up to the fall and EMS suspected that he may have had a syncopal episode.  He denies any current chest pain, headache, visual changes or other complaints at this time.  Had repetitive questioning en route with EMS  Review of Systems  Positive: Fall, loss of consciousness Negative: Chest pain  Physical Exam  There were no vitals taken for this visit. Gen:   Awake, no distress   Resp:  Normal effort  MSK:   Moves extremities without difficulty  Other:  Cn II-XII intact. 5/5 strength to the bue/ble. Jagged laceration to the right lower lip  Medical Decision Making  Medically screening exam initiated at 9:23 PM.  Appropriate orders placed.  Clarence Kirk was informed that the remainder of the evaluation will be completed by another provider, this initial triage assessment does not replace that evaluation, and the importance of remaining in the ED until their evaluation is complete.     Clarence Kirk 07/14/21 2130    Terrilee Files, MD 07/15/21 1002

## 2021-07-15 ENCOUNTER — Emergency Department (HOSPITAL_COMMUNITY): Payer: BC Managed Care – PPO

## 2021-07-15 LAB — TROPONIN I (HIGH SENSITIVITY): Troponin I (High Sensitivity): 15 ng/L (ref <18)

## 2021-07-15 MED ORDER — HYDRALAZINE HCL 25 MG PO TABS: 25.0000 mg | ORAL_TABLET | Freq: Once | ORAL | Status: DC

## 2021-07-15 MED ORDER — LIDOCAINE HCL (PF) 1 % IJ SOLN
10.0000 mL | Freq: Once | INTRAMUSCULAR | Status: DC
  Filled 2021-07-15: qty 10

## 2021-07-15 MED ORDER — TETANUS-DIPHTH-ACELL PERTUSSIS 5-2.5-18.5 LF-MCG/0.5 IM SUSY
0.5000 mL | PREFILLED_SYRINGE | Freq: Once | INTRAMUSCULAR | Status: AC
  Administered 2021-07-15: 0.5 mL via INTRAMUSCULAR
  Filled 2021-07-15: qty 0.5

## 2021-07-15 MED ORDER — AMLODIPINE BESYLATE 5 MG PO TABS: 5.0000 mg | ORAL_TABLET | Freq: Every day | ORAL | 0 refills | Status: DC

## 2021-07-15 MED ORDER — AMLODIPINE BESYLATE 5 MG PO TABS
5.0000 mg | ORAL_TABLET | Freq: Once | ORAL | Status: AC
  Administered 2021-07-15: 5 mg via ORAL
  Filled 2021-07-15: qty 1

## 2021-07-15 MED ORDER — IOHEXOL 350 MG/ML SOLN
50.0000 mL | Freq: Once | INTRAVENOUS | Status: AC | PRN
  Administered 2021-07-15: 50 mL via INTRAVENOUS

## 2021-07-15 NOTE — ED Provider Notes (Signed)
  Face-to-face evaluation   History: He presents for evaluation of an episode of syncope that occurred yesterday.  He had a prolonged ED waiting room delay.  He remembers coughing prior to the syncopal episode.  Patient's wife heard him fall and found him unconscious, for 2 minutes.  After that he was able ambulate.  He complains of pain in his chin, his lips, his left arm.  He has chronic known hypertension for which he is trying to treat with exercise and diet.  He is recovering from COVID, onset of symptoms about 3 weeks ago.  Physical exam: Alert, calm, cooperative.  No dysarthria or aphasia.  Right lower lip has a gaping laceration about 1.5 cm in length.  There is no trismus.  No visible dental injury.  No through and through laceration.  No respiratory distress  Medical screening examination/treatment/procedure(s) were conducted as a shared visit with non-physician practitioner(s) and myself.  I personally evaluated the patient during the encounter    Mancel Bale, MD 07/15/21 1946

## 2021-07-15 NOTE — Discharge Instructions (Addendum)
Lip injury: Please monitor this for any signs of infection including redness, increased swelling, heat, pus drainage.  Please follow-up immediately if you notice any of these signs or symptoms.  You may have a scar on your lower lip, but should not have any long-term numbness, or other difficulty.  Thumb: Please keep your left thumb in the splint for the next 2 to 3 weeks, or until your primary care orthopedic doctor instructs you.  After around 2 or 3 weeks you can begin gently stretching and moving the joint.  You can take ibuprofen, Tylenol as needed for pain control.  Aortic aneurysm: As we discussed you have a slight dilation of your thoracic aorta, the official recommendation from the American Heart Association is to follow-up for repeat imaging on an annual basis.  Blood pressure: Please begin taking the amlodipine 5 mg daily as prescribed.  Continue to monitor your blood pressure at home.  Encourage you to continue a healthy diet, low-sodium diet.  Please follow-up with your primary care at your earliest convenience for further evaluation.

## 2021-07-15 NOTE — ED Notes (Signed)
Pt verbalized understanding of d/c instructions, meds, and followup care. Denies questions. VSS, no distress noted. Still very hypertensive, provider made aware and deemed okay for discharge.Steady gait to exit with all belongings.

## 2021-07-15 NOTE — Progress Notes (Signed)
Orthopedic Tech Progress Note Patient Details:  Clarence Kirk [birth year] [id number]  Ortho Devices Type of Ortho Device: Finger splint Ortho Device/Splint Location: LUE Ortho Device/Splint Interventions: Ordered, Application   Post Interventions Patient Tolerated: Well Instructions Provided: Care of device  Donald Pore 07/15/2021, 5:43 PM

## 2021-07-15 NOTE — Telephone Encounter (Signed)
I left another message for pt. I see that he is in the ER. Will forward to Dr Alphonsus Sias as Lorain Childes.

## 2021-07-15 NOTE — ED Notes (Signed)
Lidocaine and suture cart at door. ?

## 2021-07-15 NOTE — ED Provider Notes (Signed)
[facility] EMERGENCY DEPARTMENT Provider Note   CSN: [id number] Arrival date & time: 07/14/21  2117     History Chief Complaint  Patient presents with   Loss of Consciousness    Clarence Kirk is a 65 y.o. male with a past medical history significant for strong family history of coronary artery disease, stroke, personal history of hypertension, and recent COVID infection within the last 2 weeks who presents after a syncopal episode last night.  Patient reports that he remembers having a coughing fit, standing in the kitchen, when he passed out, the next thing he remembers was waking up with his wife and firefighters surrounding him before getting in the EMS vehicle and being brought to the hospital.  His wife reports that he was standing in the kitchen, doing nothing when suddenly she heard a thump, went to see the patient on the ground and he was awake, with dilated pupils, a cut on his right lower lip, injury to his right chin, patient was confused, did not remember the fall.  Patient was alert and oriented x3 once in the EMS, however he has maintained amnesia of the syncopal episode.  Patient has not had similar syncopal episodes in the past, patient denies recent travel, patient is not taking a blood thinner, patient is not take anything for his blood pressure other than natural remedies.  Patient does not recall when his last tetanus shot was. Patient reports injury to left thumb as well.   Loss of Consciousness     History reviewed. No pertinent past medical history.  Patient Active Problem List   Diagnosis Date Noted   Preventative health care 02/19/2016   OTHER ABNORMAL GLUCOSE 08/19/2010   ELEVATED BLOOD PRESSURE WITHOUT DIAGNOSIS OF HYPERTENSION 06/25/2010    Past Surgical History:  Procedure Laterality Date   BASAL CELL CARCINOMA EXCISION     multiple   MELANOMA EXCISION  ~2009   back        Family History  Problem Relation Age of Onset   Heart  disease Mother    Alzheimer's disease Mother    Heart disease Maternal Grandfather    Diabetes Neg Hx    Cancer Neg Hx     Social History   Tobacco Use   Smoking status: Never   Smokeless tobacco: Never  Substance Use Topics   Alcohol use: Yes    Alcohol/week: 0.0 standard drinks    Comment: enjoys red wine    Home Medications Prior to Admission medications   Medication Sig Start Date End Date Taking? Authorizing Provider  amLODipine (NORVASC) 5 MG tablet Take 1 tablet (5 mg total) by mouth daily. 07/15/21  Yes Jaedynn Bohlken H, PA-C  Multiple Vitamin (MULTIVITAMIN) capsule Take 1 capsule by mouth daily.   Yes [provider]  Omega-3 Fatty Acids (FISH OIL) 1000 MG CAPS Take 1 capsule by mouth.   Yes [provider]  OVER THE COUNTER MEDICATION Take 2 Scoops by mouth daily. Beet Powder supplement   Yes [provider]  OVER THE COUNTER MEDICATION Take 2 tablets by mouth daily. Sambucol elderberry gummies   Yes [provider]  promethazine-dextromethorphan (PROMETHAZINE-DM) 6.25-15 MG/5ML syrup Take by mouth. 07/09/21 07/16/21 Yes [provider]    Allergies    Neomycin-bacitracin zn-polymyx and Tape  Review of Systems   Review of Systems  Respiratory:  Positive for cough.   Cardiovascular:  Positive for syncope.  All other systems reviewed and are negative.  Physical Exam Updated  Vital Signs BP (!) 201/91   Pulse 70   Temp 98.2 F (36.8 C) (Oral)   Resp 17   Ht 5\' 9"  (1.753 m)   Wt 80.7 kg   SpO2 98%   BMI 26.29 kg/m   Physical Exam Vitals and nursing note reviewed.  Constitutional:      General: He is not in acute distress.    Appearance: Normal appearance.  HENT:     Head: Normocephalic.     Comments: Bruising noted on the right chin, there is a small perhaps 1 cm laceration with some crusting, and swelling on the right lower lip, the laceration does seem to involve the vermilion border.  Is not actively  bleeding at this time. Eyes:     General:        Right eye: No discharge.        Left eye: No discharge.  Cardiovascular:     Rate and Rhythm: Normal rate and regular rhythm.     Pulses: Normal pulses.     Heart sounds: No murmur heard.   No friction rub. No gallop.  Pulmonary:     Effort: Pulmonary effort is normal.     Breath sounds: Normal breath sounds.  Abdominal:     General: Bowel sounds are normal.     Palpations: Abdomen is soft.  Musculoskeletal:     Comments: Bruising, injury noted to left thumb patient is able to oppose thumb without pain, however he has tenderness to palpation, some difficulty with flexion.  Distal capillary refill intact.  Skin:    General: Skin is warm and dry.     Capillary Refill: Capillary refill takes less than 2 seconds.  Neurological:     Mental Status: He is alert and oriented to person, place, and time.  Psychiatric:        Mood and Affect: Mood normal.        Behavior: Behavior normal.    ED Results / Procedures / Treatments   Labs (all labs ordered are listed, but only abnormal results are displayed) Labs Reviewed  CBC WITH DIFFERENTIAL/PLATELET - Abnormal; Notable for the following components:      Result Value   RBC 4.08 (*)    HCT 38.7 (*)    All other components within normal limits  COMPREHENSIVE METABOLIC PANEL - Abnormal; Notable for the following components:   Glucose, Bld 115 (*)    All other components within normal limits  TROPONIN I (HIGH SENSITIVITY)  TROPONIN I (HIGH SENSITIVITY)    EKG EKG Interpretation  Date/Time:  Monday July 14 2021 21:25:13 EDT Ventricular Rate:  101 PR Interval:  134 QRS Duration: 96 QT Interval:  356 QTC Calculation: 461 R Axis:   -75 Text Interpretation: Sinus tachycardia Incomplete right bundle branch block Left anterior fascicular block Minimal voltage criteria for LVH, may be normal variant ( Cornell product ) Cannot rule out Anterior infarct , age undetermined Abnormal ECG  Confirmed by [id number] [phone]) on 07/15/2021 12:47:56 AM  Radiology CT Head Wo Contrast  Result Date: 07/14/2021 CLINICAL DATA:  Syncope, fall EXAM: CT HEAD WITHOUT CONTRAST TECHNIQUE: Contiguous axial images were obtained from the base of the skull through the vertex without intravenous contrast. COMPARISON:  None. FINDINGS: Brain: No evidence of acute infarction, hemorrhage, hydrocephalus, extra-axial collection or mass lesion/mass effect. Subcortical white matter and periventricular small vessel ischemic changes. Vascular: Intracranial atherosclerosis. Skull: Normal. Negative for fracture or focal lesion. Sinuses/Orbits: The visualized paranasal sinuses are essentially clear. The  mastoid air cells are unopacified. Other: None. IMPRESSION: No evidence of acute intracranial abnormality. Small vessel ischemic changes. Electronically Signed   By: Charline Bills M.D.   On: 07/14/2021 22:17   CT Angio Chest PE W and/or Wo Contrast  Result Date: 07/15/2021 CLINICAL DATA:  PE suspected, high prob EXAM: CT ANGIOGRAPHY CHEST WITH CONTRAST TECHNIQUE: Multidetector CT imaging of the chest was performed using the standard protocol during bolus administration of intravenous contrast. Multiplanar CT image reconstructions and MIPs were obtained to evaluate the vascular anatomy. CONTRAST:  29mL OMNIPAQUE IOHEXOL 350 MG/ML SOLN COMPARISON:  None. FINDINGS: Cardiovascular: Limited evaluation of the lung bases secondary to respiratory motion. Satisfactory opacification of the pulmonary arteries to the segmental level. No evidence of pulmonary embolism. Mild cardiomegaly. No pericardial effusion. Atherosclerotic calcifications of the aorta, mild. Aneurysmal dilation of the ascending thoracic aorta 4.4 x 4.2 cm. LEFT-sided coronary artery atherosclerotic calcifications. Mediastinum/Nodes: Visualized thyroid is unremarkable. No axillary or mediastinal adenopathy. Lungs/Pleura: Scattered bibasilar atelectasis. No  pleural effusion or pneumothorax. Upper Abdomen: No acute abnormality. Musculoskeletal: No chest wall abnormality. No acute or significant osseous findings. Review of the MIP images confirms the above findings. IMPRESSION: 1. No acute pulmonary embolism. 2. Aneurysmal dilation of the ascending thoracic aorta to 4.4 x 4.2 cm. Recommend annual imaging followup by CTA or MRA. This recommendation follows 2010 ACCF/AHA/AATS/ACR/ASA/SCA/SCAI/SIR/STS/SVM Guidelines for the Diagnosis and Management of Patients with Thoracic Aortic Disease. Circulation. 2010; 121: W409-B353. Aortic aneurysm NOS (ICD10-I71.9) Aortic Atherosclerosis (ICD10-I70.0). Electronically Signed   By: Meda Klinefelter M.D.   On: 07/15/2021 16:01   DG Finger Thumb Left  Result Date: 07/15/2021 CLINICAL DATA:  Post reduction EXAM: LEFT THUMB 2+V COMPARISON:  07/15/2021 FINDINGS: Reduction of previously noted IP joint dislocation, now with normal alignment. No definitive fracture is seen. IMPRESSION: Reduction of previously noted first IP joint dislocation. Electronically Signed   By: Jasmine Pang M.D.   On: 07/15/2021 16:38   DG Finger Thumb Left  Result Date: 07/15/2021 CLINICAL DATA:  Thumb injury EXAM: LEFT THUMB 3V COMPARISON:  None. FINDINGS: Complete dorsal dislocation of the distal phalanx of the thumb. No evidence of fracture. Surrounding soft tissue swelling. IMPRESSION: Complete dorsal dislocation of the distal phalanx of the thumb. Electronically Signed   By: Allegra Lai M.D.   On: 07/15/2021 13:51    Procedures Procedures   Medications Ordered in ED Medications  lidocaine (PF) (XYLOCAINE) 1 % injection 10 mL (has no administration in time range)  amLODipine (NORVASC) tablet 5 mg (5 mg Oral Given 07/15/21 1342)  Tdap (BOOSTRIX) injection 0.5 mL (0.5 mLs Intramuscular Given 07/15/21 1344)  iohexol (OMNIPAQUE) 350 MG/ML injection 50 mL (50 mLs Intravenous Contrast Given 07/15/21 1530)    ED Course  I have reviewed  the triage vital signs and the nursing notes.  Pertinent labs & imaging results that were available during my care of the patient were reviewed by me and considered in my medical decision making (see chart for details).    MDM Rules/Calculators/A&P                         I discussed this case with my attending physician who cosigned this note including patient's presenting symptoms, physical exam, and planned diagnostics and interventions. Attending physician stated agreement with plan or made changes to plan which were implemented.   Attending physician assessed patient at bedside.  Otherwise well-appearing male with laceration noted to the right lower lip, discussed suturing  versus leaving to heal by secondary intention, patient would prefer to let heal by secondary intention at this point. Discussed signs of infection to look out for.  Left thumb injury, radiograph reveals dislocation.  Will attempt manual relocation.  In context of protracted high blood pressure, recent COVID, syncope and collapse, do feel that patient has significant risk factors for pulmonary embolism at this time.  We will obtain a CT angiogram of the chest to rule out pulmonary embolism.  Left thumb with dislocation.  Reduced with digital nerve block without difficulty.  Radiographic imaging reveals pleated reduction without new fracture.  Patient with decreased pain, increased range of motion of the affected digit.  Continues to have intact capillary refill.  Neurovascularly intact.  CT angiogram does reveal a ascending thoracic aorta aneurysm of 4.2 x 4.4 cm.  I recommend reevaluation in 1 year with CTA or MRA.  No evidence of pulmonary embolism.  We will splint the left thumb, encouraged Tylenol, ibuprofen for pain control.  In context of elevated blood pressure throughout the duration of patient's visit, we will prescribe amlodipine 5 mg to take daily, do encourage low-sodium diet, other lifestyle management.   Encouraged close follow-up with primary care for reevaluation of blood pressure at this time.  Patient discharged in stable condition.  Return precautions are given. Final Clinical Impression(s) / ED Diagnoses Final diagnoses:  Syncope and collapse  Lip laceration, initial encounter  Closed dislocation of interphalangeal joint of left thumb, initial encounter  Aneurysm of ascending aorta without rupture    Rx / DC Orders ED Discharge Orders          Ordered    amLODipine (NORVASC) 5 MG tablet  Daily        07/15/21 1637             Ameyah Bangura, [place] H, PA-C 07/15/21 1646    Mancel Bale, MD 07/15/21 1947

## 2021-07-21 ENCOUNTER — Encounter: Payer: Self-pay | Admitting: Internal Medicine

## 2021-07-21 ENCOUNTER — Other Ambulatory Visit: Payer: Self-pay

## 2021-07-21 ENCOUNTER — Ambulatory Visit: Payer: BC Managed Care – PPO | Admitting: Internal Medicine

## 2021-07-21 VITALS — BP 134/90 | HR 99 | Temp 97.1°F | Ht 67.0 in | Wt 175.0 lb

## 2021-07-21 DIAGNOSIS — R55 Syncope and collapse: Secondary | ICD-10-CM | POA: Diagnosis not present

## 2021-07-21 DIAGNOSIS — I700 Atherosclerosis of aorta: Secondary | ICD-10-CM | POA: Diagnosis not present

## 2021-07-21 DIAGNOSIS — F39 Unspecified mood [affective] disorder: Secondary | ICD-10-CM

## 2021-07-21 DIAGNOSIS — I10 Essential (primary) hypertension: Secondary | ICD-10-CM

## 2021-07-21 DIAGNOSIS — I7121 Aneurysm of the ascending aorta, without rupture: Secondary | ICD-10-CM | POA: Diagnosis not present

## 2021-07-21 MED ORDER — DULOXETINE HCL 30 MG PO CPEP: 30.0000 mg | ORAL_CAPSULE | Freq: Every day | ORAL | 3 refills | Status: AC

## 2021-07-21 NOTE — Telephone Encounter (Signed)
Pt was seen today in office. 

## 2021-07-21 NOTE — Assessment & Plan Note (Signed)
No action for now.

## 2021-07-21 NOTE — Assessment & Plan Note (Signed)
Chronic anxiety He and wife are determined to start something --will try low dose duloxetine

## 2021-07-21 NOTE — Progress Notes (Signed)
Subjective:    Patient ID: Clarence Kirk, male    DOB: [birth year], 65 y.o.   MRN: [id number]  HPI Here with wife for ER follow up Hasn't been seen here in over 3 years This visit occurred during the SARS-CoV-2 public health emergency.  Safety protocols were in place, including screening questions prior to the visit, additional usage of staff PPE, and extensive cleaning of exam room while observing appropriate contact time as indicated for disinfecting solutions.   Reviewed records, EKG, etc from recent ER visit  Went hiking in [place]--but got sore throat the 3rd night there This was 9/24 Thinks he got it on the plane----tested positive Isolated and stayed out of work---mild Just tired--but then had persistent cough Went to walk in clinic due to this---got narcotic cough syrup  10/17 he had dinner (no recent codeine medication) Did have 2 beers a few hours before dinner Then started with cough and trouble breathing That is last he remembers until EMS was already then Wife heard crash---unconscious for at least 2 minutes Blood gushing from lip, no seizure or abnormal movements  Brought to hospital but waited 17 hours in ER Chest CT showed no PE--but ascending thoracic aneurysm noted and aortic atherosclerosis EKG shows no ischemia  BP markedly elevated while there No chest pain or SOB  Has stayed very active Has hiked as much as 28 miles in 1 day (last April)  Wife is concerned about his severe anxiety Life long but now affecting him more Still grieving for mom who died a year ago or so  Current Outpatient Medications on File Prior to Visit  Medication Sig Dispense Refill   amLODipine (NORVASC) 5 MG tablet Take 1 tablet (5 mg total) by mouth daily. 30 tablet 0   Multiple Vitamin (MULTIVITAMIN) capsule Take 1 capsule by mouth daily.     Omega-3 Fatty Acids (FISH OIL) 1000 MG CAPS Take 1 capsule by mouth.     OVER THE COUNTER MEDICATION Take 2 Scoops by mouth daily. Beet  Powder supplement     OVER THE COUNTER MEDICATION Take 2 tablets by mouth daily. Sambucol elderberry gummies     No current facility-administered medications on file prior to visit.    Allergies  Allergen Reactions   Neomycin-Bacitracin Zn-Polymyx     REACTION: rash   Tape Rash    History reviewed. No pertinent past medical history.  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[employer]  Tobacco Use   Smoking status: Never   Smokeless tobacco: Never  Substance and Sexual Activity   Alcohol use: Yes    Alcohol/week: 0.0 standard drinks    Comment: enjoys red wine   Drug use: Not on file   Sexual activity: Not on file  Other Topics Concern   Not on file  Social History Narrative   Not  on file   Social Determinants of Health   Financial Resource Strain: Not on file  Food Insecurity: Not on file  Transportation Needs: Not on file  Physical Activity: Not on file  Stress: Not on file  Social Connections: Not on file  Intimate Partner Violence: Not on file   Review of Systems Is "backing out of our business"---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  Cardiovascular:     Rate and Rhythm: Normal rate and regular rhythm.     Pulses: Normal pulses.      Heart sounds: No murmur heard.   No gallop.  Pulmonary:     Effort: Pulmonary effort is normal.     Breath sounds: Normal breath sounds. No wheezing or rales.  Abdominal:     Palpations: Abdomen is soft.     Tenderness: There is no abdominal tenderness.  Musculoskeletal:     Cervical back: Neck supple.     Right lower leg: No edema.     Left lower leg: No edema.  Lymphadenopathy:     Cervical: No cervical adenopathy.  Neurological:     Mental Status: He is alert.  Psychiatric:     Comments: Anxious No clear depression           Assessment & Plan:

## 2021-07-21 NOTE — Assessment & Plan Note (Signed)
Will need follow up

## 2021-07-21 NOTE — Assessment & Plan Note (Signed)
Seems to have been cough syncope Wife is concerned and would like cardiology evaluation--will make referral

## 2021-07-21 NOTE — Assessment & Plan Note (Signed)
BP Readings from Last 3 Encounters:  07/21/21 134/90  07/15/21 (!) 197/94  12/06/17 (!) 240/90   Seems to have white coat component---long standing Will continue the amlodipine for now

## 2021-07-24 ENCOUNTER — Ambulatory Visit (INDEPENDENT_AMBULATORY_CARE_PROVIDER_SITE_OTHER): Payer: BC Managed Care – PPO

## 2021-07-24 ENCOUNTER — Ambulatory Visit: Payer: BC Managed Care – PPO | Admitting: Cardiology

## 2021-07-24 ENCOUNTER — Other Ambulatory Visit: Payer: Self-pay

## 2021-07-24 ENCOUNTER — Encounter: Payer: Self-pay | Admitting: Cardiology

## 2021-07-24 VITALS — BP 240/110 | HR 82 | Ht 68.0 in | Wt 177.0 lb

## 2021-07-24 DIAGNOSIS — R55 Syncope and collapse: Secondary | ICD-10-CM

## 2021-07-24 DIAGNOSIS — I10 Essential (primary) hypertension: Secondary | ICD-10-CM

## 2021-07-24 DIAGNOSIS — I7121 Aneurysm of the ascending aorta, without rupture: Secondary | ICD-10-CM

## 2021-07-24 MED ORDER — LOSARTAN POTASSIUM 50 MG PO TABS: 50.0000 mg | ORAL_TABLET | Freq: Every day | ORAL | 5 refills | Status: DC

## 2021-07-24 NOTE — Patient Instructions (Signed)
Medication Instructions:   Your physician has recommended you make the following change in your medication:    START taking Losartan 50 MG once a day.   *If you need a refill on your cardiac medications before your next appointment, please call your pharmacy*   Lab Work: None ordered If you have labs (blood work) drawn today and your tests are completely normal, you will receive your results only by: MyChart Message (if you have MyChart) OR A paper copy in the mail If you have any lab test that is abnormal or we need to change your treatment, we will call you to review the results.   Testing/Procedures:   Your physician has requested that you have an echocardiogram. Echocardiography is a painless test that uses sound waves to create images of your heart. It provides your doctor with information about the size and shape of your heart and how well your heart's chambers and valves are working. This procedure takes approximately one hour. There are no restrictions for this procedure.  Your physician has recommended that you wear a Zio XT monitor for 2 weeks.   This monitor is a medical device that records the heart's electrical activity. Doctors most often use these monitors to diagnose arrhythmias. Arrhythmias are problems with the speed or rhythm of the heartbeat. The monitor is a small device applied to your chest. You can wear one while you do your normal daily activities. While wearing this monitor if you have any symptoms to push the button and record what you felt. Once you have worn this monitor for the period of time provider prescribed (Usually 14 days), you will return the monitor device in the postage paid box. Once it is returned they will download the data collected and provide [place] with a report which the provider will then review and we will call you with those results. Important tips:  Avoid showering during the first 24 hours of wearing the monitor. Avoid excessive sweating to  help maximize wear time. Do not submerge the device, no hot tubs, and no swimming pools. Keep any lotions or oils away from the patch. After 24 hours you may shower with the patch on. Take brief showers with your back facing the shower head.  Do not remove patch once it has been placed because that will interrupt data and decrease adhesive wear time. Push the button when you have any symptoms and write down what you were feeling. Once you have completed wearing your monitor, remove and place into box which has postage paid and place in your outgoing mailbox.  If for some reason you have misplaced your box then call our office and we can provide another box and/or mail it off for you.    Follow-Up: At [facility], you and your health needs are our priority.  As part of our continuing mission to provide you with exceptional heart care, we have created designated Provider Care Teams.  These Care Teams include your primary Cardiologist (physician) and Advanced Practice Providers (APPs -  Physician Assistants and Nurse Practitioners) who all work together to provide you with the care you need, when you need it.  We recommend signing up for the patient portal called "MyChart".  Sign up information is provided on this After Visit Summary.  MyChart is used to connect with patients for Virtual Visits (Telemedicine).  Patients are able to view lab/test results, encounter notes, upcoming appointments, etc.  Non-urgent messages can be sent to your provider as well.  To learn more about what you can do with MyChart, go to [email or web address].    Your next appointment:   6 week(s)  The format for your next appointment:   In Person  Provider:   Kate Sable, MD   Other Instructions

## 2021-07-24 NOTE — Progress Notes (Signed)
Cardiology Office Note:    Date:  07/24/2021   ID:  Clarence Kirk, DOB 1956/08/09, MRN [id number]  PCP:  Karie Schwalbe, MD   [facility] Providers Cardiologist:  Debbe Odea, MD     Referring MD: Karie Schwalbe, MD   Chief Complaint  Patient presents with   New Patient (Initial Visit)    Referred by PCP for Syncope. Patient states he had a  coughing spell last Monday and passed out. Meds reviewed verbally with patient.    Clarence Kirk is a 65 y.o. male who is being seen today for the evaluation of syncope at the request of Karie Schwalbe, MD.   History of Present Illness:    Clarence Kirk is a 65 y.o. male with a hx of hypertension who presents due to syncope.  Patient recently seen in the hospital due to an episode of syncope.  He states having persistent cough prior to passing out.  Denies any prior occurrence.  Blood pressure was noted during hospital visit, started on amlodipine upon discharge.  He works out very frequently on his stationary bike, hikes very often, denies chest pain or shortness of breath.  Chest CT obtained in the hospital showed ascending aorta measuring 4.4 x 4.2 cm.  Denies any history of heart disease.  History reviewed. No pertinent past medical history.  Past Surgical History:  Procedure Laterality Date   BASAL CELL CARCINOMA EXCISION     multiple   MELANOMA EXCISION  ~2009   back     Current Medications: Current Meds  Medication Sig   amLODipine (NORVASC) 5 MG tablet Take 1 tablet (5 mg total) by mouth daily.   DULoxetine (CYMBALTA) 30 MG capsule Take 1 capsule (30 mg total) by mouth daily.   losartan (COZAAR) 50 MG tablet Take 1 tablet (50 mg total) by mouth daily.   Multiple Vitamin (MULTIVITAMIN) capsule Take 1 capsule by mouth daily.   Omega-3 Fatty Acids (FISH OIL) 1000 MG CAPS Take 1 capsule by mouth.   OVER THE COUNTER MEDICATION Take 2 Scoops by mouth daily. Beet Powder supplement   OVER THE COUNTER MEDICATION Take 2  tablets by mouth daily. 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[employer]  Tobacco Use   Smoking status: Never   Smokeless tobacco: Never  Substance and Sexual Activity   Alcohol use: Yes    Alcohol/week: 0.0 standard drinks    Comment: enjoys red wine   Drug use: Not on file   Sexual activity: Not on file  Other Topics Concern   Not on file  Social History Narrative   Not on file   Social Determinants of Health   Financial Resource Strain: Not on file  Food Insecurity: Not on file  Transportation Needs: Not on file  Physical Activity: Not on file  Stress: Not on file  Social Connections: Not on file     Family History: The patient's family history includes Alzheimer's disease in his mother; Dementia in his mother; Heart disease in his maternal grandfather and mother; Hypertension in his maternal grandfather, maternal grandmother, and mother. There is no history of Diabetes or Cancer.  ROS:   Please  see the history of present illness.     All other systems reviewed and are negative.  EKGs/Labs/Other Studies Reviewed:    The following studies were reviewed today:   EKG:  EKG is  ordered today.  The ekg ordered today demonstrates normal sinus rhythm, left anterior hemiblock  Recent Labs: 07/14/2021: ALT 18; BUN 20; Creatinine, Ser 1.06; Hemoglobin 13.3; Platelets 265; Potassium 3.7; Sodium 137  Recent Lipid Panel    Component Value Date/Time   CHOL 222 (H) 12/02/2017 0851   TRIG 90.0 12/02/2017 0851   HDL 82.00 12/02/2017 0851   CHOLHDL 3 12/02/2017 0851   VLDL 18.0 12/02/2017 0851   LDLCALC 122 (H) 12/02/2017 0851   LDLDIRECT 150.7 06/25/2010 1233     Risk  Assessment/Calculations:          Physical Exam:    VS:  BP (!) 240/110 (BP Location: Left Arm, Patient Position: Sitting, Cuff Size: Normal)   Pulse 82   Ht 5\' 8"  (1.727 m)   Wt 177 lb (80.3 kg)   SpO2 99%   BMI 26.91 kg/m     Wt Readings from Last 3 Encounters:  07/24/21 177 lb (80.3 kg)  07/21/21 175 lb (79.4 kg)  07/15/21 178 lb (80.7 kg)     GEN:  Well nourished, well developed in no acute distress HEENT: Normal NECK: No JVD; No carotid bruits LYMPHATICS: No lymphadenopathy CARDIAC: RRR, no murmurs, rubs, gallops RESPIRATORY:  Clear to auscultation without rales, wheezing or rhonchi  ABDOMEN: Soft, non-tender, non-distended MUSCULOSKELETAL:  No edema; No deformity  SKIN: Warm and dry NEUROLOGIC:  Alert and oriented x 3 PSYCHIATRIC:  Normal affect   ASSESSMENT:    1. Syncope and collapse   2. Primary hypertension   3. Aneurysm of ascending aorta without rupture    PLAN:    In order of problems listed above:  Syncope, possibly cough induced.  No further episodes.  Obtain echocardiogram, place cardiac monitor to evaluate any significant arrhythmias.  If cardiac work-up is otherwise unrevealing, and patient has recurrent cough with syncope, will consider pulmonary input. Hypertension, BP elevated.  May have a component of whitecoat syndrome.  Start losartan 50 mg daily, continue amlodipine.  Check BP at home, keep a log. Mild ascending aorta dilatation, monitor yearly with serial imaging/chest CT.  Follow-up after echo and cardiac monitor.    Medication Adjustments/Labs and Tests Ordered: Current medicines are reviewed at length with the patient today.  Concerns regarding medicines are outlined above.  Orders Placed This Encounter  Procedures   LONG TERM MONITOR (3-14 DAYS)   EKG 12-Lead   ECHOCARDIOGRAM COMPLETE    Meds ordered this encounter  Medications   losartan (COZAAR) 50 MG tablet    Sig: Take 1 tablet (50 mg total) by mouth daily.    Dispense:   30 tablet    Refill:  5     Patient Instructions  Medication Instructions:   Your physician has recommended you make the following change in your medication:    START taking Losartan 50 MG once a day.   *If you need a refill on your cardiac medications before your next appointment, please call your pharmacy*   Lab Work: None ordered If you have labs (blood work) drawn today and your tests are completely normal, you will receive your results only by: MyChart Message (if you have MyChart) OR A paper copy in the mail If you have any lab test that is abnormal or we need to change your  treatment, we will call you to review the results.   Testing/Procedures:   Your physician has requested that you have an echocardiogram. Echocardiography is a painless test that uses sound waves to create images of your heart. It provides your doctor with information about the size and shape of your heart and how well your heart's chambers and valves are working. This procedure takes approximately one hour. There are no restrictions for this procedure.  Your physician has recommended that you wear a Zio XT monitor for 2 weeks.   This monitor is a medical device that records the heart's electrical activity. Doctors most often use these monitors to diagnose arrhythmias. Arrhythmias are problems with the speed or rhythm of the heartbeat. The monitor is a small device applied to your chest. You can wear one while you do your normal daily activities. While wearing this monitor if you have any symptoms to push the button and record what you felt. Once you have worn this monitor for the period of time provider prescribed (Usually 14 days), you will return the monitor device in the postage paid box. Once it is returned they will download the data collected and provide [place] with a report which the provider will then review and we will call you with those results. Important tips:  Avoid showering during the first 24  hours of wearing the monitor. Avoid excessive sweating to help maximize wear time. Do not submerge the device, no hot tubs, and no swimming pools. Keep any lotions or oils away from the patch. After 24 hours you may shower with the patch on. Take brief showers with your back facing the shower head.  Do not remove patch once it has been placed because that will interrupt data and decrease adhesive wear time. Push the button when you have any symptoms and write down what you were feeling. Once you have completed wearing your monitor, remove and place into box which has postage paid and place in your outgoing mailbox.  If for some reason you have misplaced your box then call our office and we can provide another box and/or mail it off for you.    Follow-Up: At [facility], you and your health needs are our priority.  As part of our continuing mission to provide you with exceptional heart care, we have created designated Provider Care Teams.  These Care Teams include your primary Cardiologist (physician) and Advanced Practice Providers (APPs -  Physician Assistants and Nurse Practitioners) who all work together to provide you with the care you need, when you need it.  We recommend signing up for the patient portal called "MyChart".  Sign up information is provided on this After Visit Summary.  MyChart is used to connect with patients for Virtual Visits (Telemedicine).  Patients are able to view lab/test results, encounter notes, upcoming appointments, etc.  Non-urgent messages can be sent to your provider as well.   To learn more about what you can do with MyChart, go to [email or web address].    Your next appointment:   6 week(s)  The format for your next appointment:   In Person  Provider:   Debbe Odea, MD   Other Instructions    Signed, Debbe Odea, MD  07/24/2021 12:36 PM    [facility]

## 2021-08-11 ENCOUNTER — Telehealth: Payer: Self-pay | Admitting: Cardiology

## 2021-08-11 MED ORDER — AMLODIPINE BESYLATE 5 MG PO TABS: 5.0000 mg | ORAL_TABLET | Freq: Every day | ORAL | 1 refills | Status: DC

## 2021-08-11 NOTE — Telephone Encounter (Signed)
Requested Prescriptions   Signed Prescriptions Disp Refills   amLODipine (NORVASC) 5 MG tablet 90 tablet 1    Sig: Take 1 tablet (5 mg total) by mouth daily.    Authorizing Provider: Debbe Odea    Ordering User: Thayer Headings, Barth Trella L

## 2021-08-11 NOTE — Telephone Encounter (Signed)
*  STAT* If patient is at the pharmacy, call can be transferred to refill team.   1. Which medications need to be refilled? (please list name of each medication and dose if known)   amlodipine 5 mg po q d   2. Which pharmacy/location (including street and city if local pharmacy) is medication to be sent to?    [facility] [address]   3. Do they need a 30 day or 90 day supply? 90

## 2021-08-11 NOTE — Telephone Encounter (Signed)
Patient reviewed ekg from last visit and wants to discuss abnormal note .

## 2021-08-12 NOTE — Telephone Encounter (Signed)
Called patient and left a detailed VM per DPR on file regarding the EKG note from Dr. Merita Norton OV as follows:  EKG:  EKG is  ordered today.  The ekg ordered today demonstrates normal sinus rhythm, left anterior hemiblock  I informed patient that a left anterior hemiblock is the term for something interfering with your heartbeat's signal when it gets to the left anterior fascicle of your heart's left bundle branch, and that it is generally thought of as a benign EKG finding per Dr. Azucena Cecil.  I encouraged the patient to call us back if he has any further questions or concerns.

## 2021-08-19 ENCOUNTER — Other Ambulatory Visit: Payer: Self-pay

## 2021-08-19 ENCOUNTER — Ambulatory Visit (INDEPENDENT_AMBULATORY_CARE_PROVIDER_SITE_OTHER): Payer: BC Managed Care – PPO

## 2021-08-19 DIAGNOSIS — R55 Syncope and collapse: Secondary | ICD-10-CM | POA: Diagnosis not present

## 2021-08-19 LAB — ECHOCARDIOGRAM COMPLETE
AR max vel: 2.26 cm2
AV Area VTI: 2.14 cm2
AV Area mean vel: 2.2 cm2
AV Mean grad: 5.5 mmHg
AV Peak grad: 9.8 mmHg
Ao pk vel: 1.57 m/s
Area-P 1/2: 2.47 cm2
Calc EF: 65.1 %
S' Lateral: 3 cm
Single Plane A2C EF: 69.5 %
Single Plane A4C EF: 57.9 %

## 2021-09-05 ENCOUNTER — Encounter: Payer: BC Managed Care – PPO | Admitting: Internal Medicine

## 2021-09-08 ENCOUNTER — Encounter: Payer: Self-pay | Admitting: Cardiology

## 2021-09-08 ENCOUNTER — Ambulatory Visit: Payer: BC Managed Care – PPO | Admitting: Cardiology

## 2021-09-08 ENCOUNTER — Other Ambulatory Visit: Payer: Self-pay

## 2021-09-08 VITALS — BP 180/80 | HR 83 | Ht 68.0 in | Wt 177.0 lb

## 2021-09-08 DIAGNOSIS — I10 Essential (primary) hypertension: Secondary | ICD-10-CM | POA: Diagnosis not present

## 2021-09-08 DIAGNOSIS — I7121 Aneurysm of the ascending aorta, without rupture: Secondary | ICD-10-CM

## 2021-09-08 DIAGNOSIS — R55 Syncope and collapse: Secondary | ICD-10-CM

## 2021-09-08 MED ORDER — LOSARTAN POTASSIUM 50 MG PO TABS: 50.0000 mg | ORAL_TABLET | Freq: Two times a day (BID) | ORAL | 5 refills | Status: DC

## 2021-09-08 NOTE — Progress Notes (Signed)
Cardiology Office Note:    Date:  09/08/2021   ID:  Clarence Kirk, DOB 1955-11-25, MRN [id number]  PCP:  Karie Schwalbe, MD   [facility] Providers Cardiologist:  Debbe Odea, MD     Referring MD: Karie Schwalbe, MD   Chief Complaint  Patient presents with   Other    Follow up post ECHO and ZIO - Meds reviewed verbally with patient.     History of Present Illness:    Clarence Kirk is a 65 y.o. male with a hx of hypertension, mild ascending aorta dilatation who presents for follow-up.  He was previously seen due to syncope.    Etiology for syncope syncope attributed to cough.  Cardiac work-up with echo cardiac monitor performed.  He denies further episodes of syncope.  No significant coughing noted as before.  Blood pressure elevated at previous visit, losartan added to amlodipine for adequate BP control.  Exercises frequently by riding his stationary bike, hiking, denies chest pain or shortness of breath.  Checks his blood pressure at home, systolics in the 130s to 160s on average.  Tolerating losartan.  Prior notes Chest CT obtained 06/2021 showed ascending aorta measuring 4.4 x 4.2 cm.  History reviewed. No pertinent past medical history.  Past Surgical History:  Procedure Laterality Date   BASAL CELL CARCINOMA EXCISION     multiple   MELANOMA EXCISION  ~2009   back     Current Medications: Current Meds  Medication Sig   amLODipine (NORVASC) 5 MG tablet Take 1 tablet (5 mg total) by mouth daily.   DULoxetine (CYMBALTA) 30 MG capsule Take 1 capsule (30 mg total) by mouth daily.   Multiple Vitamin (MULTIVITAMIN) capsule Take 1 capsule by mouth daily.   Omega-3 Fatty Acids (FISH OIL) 1000 MG CAPS Take 1 capsule by mouth.   OVER THE COUNTER MEDICATION Take 2 Scoops by mouth daily. Beet Powder supplement   OVER THE COUNTER MEDICATION Take 2 tablets by mouth daily. Sambucol elderberry gummies   [DISCONTINUED] losartan (COZAAR) 50 MG tablet Take 1 tablet (50  mg total) by mouth daily.     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[employer]  Tobacco Use   Smoking status: Never   Smokeless tobacco: Never  Substance and Sexual Activity   Alcohol use: Yes    Alcohol/week: 0.0 standard drinks    Comment: enjoys red wine   Drug use: Not on file   Sexual activity: Not on file  Other Topics Concern   Not on file  Social History Narrative   Not on file   Social Determinants of Health   Financial Resource Strain: Not on file  Food Insecurity: Not on file  Transportation Needs: Not on file  Physical Activity: Not on file  Stress: Not on file  Social Connections: Not on file     Family History: The patient's family history includes Alzheimer's disease in his mother; Dementia in his mother; Heart disease in his maternal grandfather and mother; Hypertension in his maternal grandfather, maternal grandmother, and mother. There is no history of Diabetes or Cancer.  ROS:   Please see the history of present illness.     All other systems reviewed and  are negative.  EKGs/Labs/Other Studies Reviewed:    The following studies were reviewed today:   EKG:  EKG not ordered today.    Recent Labs: 07/14/2021: ALT 18; BUN 20; Creatinine, Ser 1.06; Hemoglobin 13.3; Platelets 265; Potassium 3.7; Sodium 137  Recent Lipid Panel    Component Value Date/Time   CHOL 222 (H) 12/02/2017 0851   TRIG 90.0 12/02/2017 0851   HDL 82.00 12/02/2017 0851   CHOLHDL 3 12/02/2017 0851   VLDL 18.0 12/02/2017 0851   LDLCALC 122 (H) 12/02/2017 0851   LDLDIRECT 150.7 06/25/2010 1233     Risk Assessment/Calculations:          Physical Exam:    VS:  BP (!) 180/80 (BP Location: Left Arm, Patient Position:  Sitting, Cuff Size: Normal)   Pulse 83   Ht 5\' 8"  (1.727 m)   Wt 177 lb (80.3 kg)   SpO2 97%   BMI 26.91 kg/m     Wt Readings from Last 3 Encounters:  09/08/21 177 lb (80.3 kg)  07/24/21 177 lb (80.3 kg)  07/21/21 175 lb (79.4 kg)     GEN:  Well nourished, well developed in no acute distress HEENT: Normal NECK: No JVD; No carotid bruits LYMPHATICS: No lymphadenopathy CARDIAC: RRR, no murmurs, rubs, gallops RESPIRATORY:  Clear to auscultation without rales, wheezing or rhonchi  ABDOMEN: Soft, non-tender, non-distended MUSCULOSKELETAL:  No edema; No deformity  SKIN: Warm and dry NEUROLOGIC:  Alert and oriented x 3 PSYCHIATRIC:  Normal affect   ASSESSMENT:    1. Syncope and collapse   2. Primary hypertension   3. Aneurysm of ascending aorta without rupture     PLAN:    In order of problems listed above:  Syncope, possibly cough induced.  No further episodes.  Echo with normal EF 60 to 65%.  No gross structural abnormalities.  Mild ascending aorta dilatation.  Cardiac monitor with occasional paroxysmal SVT, no significant arrhythmias to suggest etiology of syncope.  If patient has recurrent cough with syncope, consider pulmonary input. Hypertension, BP elevated but improved.  May have a component of whitecoat syndrome.  Increase losartan to 50 mg bid, continue amlodipine.  Check BP at home, keep a log. Mild ascending aorta dilatation, monitor yearly with serial imaging/echo.  Follow-up in 3 months.    Medication Adjustments/Labs and Tests Ordered: Current medicines are reviewed at length with the patient today.  Concerns regarding medicines are outlined above.  No orders of the defined types were placed in this encounter.   Meds ordered this encounter  Medications   losartan (COZAAR) 50 MG tablet    Sig: Take 1 tablet (50 mg total) by mouth in the morning and at bedtime.    Dispense:  30 tablet    Refill:  5      Patient Instructions  Medication Instructions:   Your physician has recommended you make the following change in your medication:   INCREASE your Losartan to 50 mg TWICE a day  *If you need a refill on your cardiac medications before your next appointment, please call your pharmacy*   Lab Work: None ordered If you have labs (blood work) drawn today and your tests are completely normal, you will receive your results only by: MyChart Message (if you have MyChart) OR A paper copy in the mail If you have any lab test that is abnormal or we need to change your treatment, we will call you to review the results.   Testing/Procedures: None ordered   Follow-Up:  At [facility], you and your health needs are our priority.  As part of our continuing mission to provide you with exceptional heart care, we have created designated Provider Care Teams.  These Care Teams include your primary Cardiologist (physician) and Advanced Practice Providers (APPs -  Physician Assistants and Nurse Practitioners) who all work together to provide you with the care you need, when you need it.  We recommend signing up for the patient portal called "MyChart".  Sign up information is provided on this After Visit Summary.  MyChart is used to connect with patients for Virtual Visits (Telemedicine).  Patients are able to view lab/test results, encounter notes, upcoming appointments, etc.  Non-urgent messages can be sent to your provider as well.   To learn more about what you can do with MyChart, go to [email or web address].    Your next appointment:   3 month(s)  The format for your next appointment:   In Person  Provider:   Debbe Odea, MD ONLY 1}    Other Instructions N/A   Signed, Debbe Odea, MD  09/08/2021 10:04 AM    [facility]

## 2021-09-08 NOTE — Patient Instructions (Signed)
Medication Instructions:  Your physician has recommended you make the following change in your medication:   INCREASE your Losartan to 50 mg TWICE a day  *If you need a refill on your cardiac medications before your next appointment, please call your pharmacy*   Lab Work: None ordered If you have labs (blood work) drawn today and your tests are completely normal, you will receive your results only by: MyChart Message (if you have MyChart) OR A paper copy in the mail If you have any lab test that is abnormal or we need to change your treatment, we will call you to review the results.   Testing/Procedures: None ordered   Follow-Up: At [facility], you and your health needs are our priority.  As part of our continuing mission to provide you with exceptional heart care, we have created designated Provider Care Teams.  These Care Teams include your primary Cardiologist (physician) and Advanced Practice Providers (APPs -  Physician Assistants and Nurse Practitioners) who all work together to provide you with the care you need, when you need it.  We recommend signing up for the patient portal called "MyChart".  Sign up information is provided on this After Visit Summary.  MyChart is used to connect with patients for Virtual Visits (Telemedicine).  Patients are able to view lab/test results, encounter notes, upcoming appointments, etc.  Non-urgent messages can be sent to your provider as well.   To learn more about what you can do with MyChart, go to [email or web address].    Your next appointment:   3 month(s)  The format for your next appointment:   In Person  Provider:   Debbe Odea, MD ONLY 1}    Other Instructions N/A

## 2021-11-04 ENCOUNTER — Encounter: Payer: BC Managed Care – PPO | Admitting: Internal Medicine

## 2021-12-08 ENCOUNTER — Ambulatory Visit: Payer: BC Managed Care – PPO | Admitting: Cardiology

## 2022-01-13 ENCOUNTER — Other Ambulatory Visit: Payer: Self-pay | Admitting: *Deleted

## 2022-01-13 DIAGNOSIS — I10 Essential (primary) hypertension: Secondary | ICD-10-CM

## 2022-01-13 MED ORDER — LOSARTAN POTASSIUM 50 MG PO TABS: 50.0000 mg | ORAL_TABLET | Freq: Two times a day (BID) | ORAL | 0 refills | Status: DC

## 2022-01-15 ENCOUNTER — Telehealth: Payer: Self-pay | Admitting: *Deleted

## 2022-01-15 ENCOUNTER — Encounter: Payer: Self-pay | Admitting: Cardiology

## 2022-01-15 ENCOUNTER — Other Ambulatory Visit: Payer: Self-pay | Admitting: Cardiology

## 2022-01-15 ENCOUNTER — Ambulatory Visit: Payer: BC Managed Care – PPO | Admitting: Cardiology

## 2022-01-15 VITALS — BP 200/100 | HR 94 | Ht 68.0 in | Wt 177.0 lb

## 2022-01-15 DIAGNOSIS — I7121 Aneurysm of the ascending aorta, without rupture: Secondary | ICD-10-CM | POA: Diagnosis not present

## 2022-01-15 DIAGNOSIS — E7800 Pure hypercholesterolemia, unspecified: Secondary | ICD-10-CM | POA: Diagnosis not present

## 2022-01-15 DIAGNOSIS — I10 Essential (primary) hypertension: Secondary | ICD-10-CM

## 2022-01-15 DIAGNOSIS — R030 Elevated blood-pressure reading, without diagnosis of hypertension: Secondary | ICD-10-CM

## 2022-01-15 DIAGNOSIS — R059 Cough, unspecified: Secondary | ICD-10-CM

## 2022-01-15 MED ORDER — AMLODIPINE BESYLATE 10 MG PO TABS: 10.0000 mg | ORAL_TABLET | Freq: Every day | ORAL | 0 refills | Status: DC

## 2022-01-15 MED ORDER — LOSARTAN POTASSIUM 100 MG PO TABS: 100.0000 mg | ORAL_TABLET | Freq: Two times a day (BID) | ORAL | 0 refills | Status: DC

## 2022-01-15 NOTE — Telephone Encounter (Signed)
Patient scheduled to come to [facility], [facility] office, Tuesday , 01/20/22 at 2:00 PM to have 24 HOUR AMB BP Monitor applied. ?

## 2022-01-15 NOTE — Patient Instructions (Signed)
Medication Instructions:  ? ?Your physician has recommended you make the following change in your medication:  ? ? INCREASE your Amlodipine to 10 MG once a day. ? ?2.    INCREASE your Losartan to 100 MG once a day. ? ? ?*If you need a refill on your cardiac medications before your next appointment, please call your pharmacy* ? ? ?Lab Work: ? ?Your physician recommends that you return for a FASTING lipid profile: at your earliest convenience. ? ?- You will need to be fasting. Please do not have anything to eat or drink after midnight the morning you have the lab work. You may only have water or black coffee with no cream or sugar.  ? ?Please return to our office on ____________________at______________am/pm ? ? ? ?Testing/Procedures: ? ?A 24 hour BP monitor has been ordered for you. Someone will reach out to you to schedule this. ? ? ?Follow-Up: ?At [facility], you and your health needs are our priority.  As part of our continuing mission to provide you with exceptional heart care, we have created designated Provider Care Teams.  These Care Teams include your primary Cardiologist (physician) and Advanced Practice Providers (APPs -  Physician Assistants and Nurse Practitioners) who all work together to provide you with the care you need, when you need it. ? ?We recommend signing up for the patient portal called "MyChart".  Sign up information is provided on this After Visit Summary.  MyChart is used to connect with patients for Virtual Visits (Telemedicine).  Patients are able to view lab/test results, encounter notes, upcoming appointments, etc.  Non-urgent messages can be sent to your provider as well.   ?To learn more about what you can do with MyChart, go to [email or web address].   ? ?Your next appointment:   ?2 month(s) ? ?The format for your next appointment:   ?In Person ? ?Provider:   ?You may see Debbe Odea, MD or one of the following Advanced Practice Providers on your designated Care Team:    ?Nicolasa Ducking, NP ?Eula Listen, PA-C ?Cadence Fransico Michael, PA-C  ? ? ?Other Instructions ? ? ?Important Information About Sugar ? ? ? ? ? ? ?

## 2022-01-15 NOTE — Progress Notes (Signed)
?Cardiology Office Note:   ? ?Date:  01/15/2022  ? ?ID:  Clarence Kirk, DOB 10-01-1955, MRN [id number] ? ?PCP:  Karie Schwalbe, MD ?  ?[facility] Providers ?Cardiologist:  Debbe Odea, MD    ? ?Referring MD: Karie Schwalbe, MD  ? ?Chief Complaint  ?Patient presents with  ? Other  ?  3 month follow up -- Meds reviewed verbally with patient  ? ? ?History of Present Illness:   ? ?Clarence Kirk is a 66 y.o. male with a hx of hypertension, mild ascending aorta dilatation who presents for follow-up.  He was previously seen due to elevated blood pressures. ? ?He has a component of whitecoat syndrome, losartan was increased to 100 mg daily.  Amlodipine continued at 5 mg daily.  His blood pressures at home range between 140s to 150s on average.  In the office blood pressures goes up to 180-200 systolic.  Denies chest pain, has not had any recent cholesterol blood work.  Takes medications as prescribed.  Previously had chronic cough leading to syncope, work-up was unrevealing.  He states coughing has improved but occasionally still coughs. ? ? ?Prior notes ?Echo 07/2021 EF 60 to 65%, ascending aorta 43 mm ?Cardiac monitor 07/2021 occasional paroxysmal SVT, no significant arrhythmias ?Chest CT obtained 06/2021 showed ascending aorta measuring 4.4 x 4.2 cm. ? ?History reviewed. No pertinent past medical history. ? ?Past Surgical History:  ?Procedure Laterality Date  ? BASAL CELL CARCINOMA EXCISION    ? multiple  ? MELANOMA EXCISION  ~2009  ? back   ? ? ?Current Medications: ?Current Meds  ?Medication Sig  ? DULoxetine (CYMBALTA) 30 MG capsule Take 1 capsule (30 mg total) by mouth daily.  ? Multiple Vitamin (MULTIVITAMIN) capsule Take 1 capsule by mouth daily.  ? Omega-3 Fatty Acids (FISH OIL) 1000 MG CAPS Take 1 capsule by mouth.  ? OVER THE COUNTER MEDICATION Take 2 Scoops by mouth daily. Beet Powder supplement  ? OVER THE COUNTER MEDICATION Take 2 tablets by mouth daily. Sambucol elderberry gummies  ?  [DISCONTINUED] amLODipine (NORVASC) 5 MG tablet Take 1 tablet (5 mg total) by mouth daily.  ? [DISCONTINUED] losartan (COZAAR) 50 MG tablet Take 1 tablet (50 mg total) by mouth in the morning and at bedtime.  ?  ? ?Allergies:   Neomycin-bacitracin zn-polymyx and Tape  ? ?Social History  ? ?Socioeconomic History  ? Marital status: Married  ?  Spouse name: Not on file  ? Number of children: 2  ? Years of education: Not on file  ? Highest education level: Not on file  ?Occupational History  ? Occupation: [employer]  ?Tobacco Use  ? Smoking status: Never  ? Smokeless tobacco: Never  ?Substance and Sexual Activity  ? Alcohol use: Yes  ?  Alcohol/week: 0.0 standard drinks  ?  Comment: enjoys red wine  ? Drug use: Not on file  ? Sexual activity: Not on file  ?Other Topics Concern  ? Not on file  ?Social History Narrative  ? Not on file  ? ?Social Determinants of Health  ? ?Financial Resource Strain: Not on file  ?Food Insecurity: Not on file  ?Transportation Needs: Not on file  ?Physical Activity: Not on file  ?Stress: Not on file  ?Social Connections: Not on file  ?  ? ?Family History: ?The patient's family history includes Alzheimer's disease in his mother; Dementia in his mother; Heart disease in his maternal grandfather and mother; Hypertension in his maternal grandfather, maternal grandmother, and mother.  There is no history of Diabetes or Cancer. ? ?ROS:   ?Please see the history of present illness.    ? All other systems reviewed and are negative. ? ?EKGs/Labs/Other Studies Reviewed:   ? ?The following studies were reviewed today: ? ? ?EKG:  EKG not ordered today.   ? ?Recent Labs: ?07/14/2021: ALT 18; BUN 20; Creatinine, Ser 1.06; Hemoglobin 13.3; Platelets 265; Potassium 3.7; Sodium 137  ?Recent Lipid Panel ?   ?Component Value Date/Time  ? CHOL 222 (H) 12/02/2017 0851  ? TRIG 90.0 12/02/2017 0851  ? HDL 82.00 12/02/2017 0851  ? CHOLHDL 3 12/02/2017 0851  ? VLDL 18.0 12/02/2017 0851  ? LDLCALC 122 (H)  12/02/2017 6222  ? LDLDIRECT 150.7 06/25/2010 1233  ? ? ? ?Risk Assessment/Calculations:   ? ? ?    ? ?Physical Exam:   ? ?VS:  BP (!) 200/100 (BP Location: Left Arm, Patient Position: Sitting, Cuff Size: Normal)   Pulse 94   Ht 5\' 8"  (1.727 m)   Wt 177 lb (80.3 kg)   SpO2 99%   BMI 26.91 kg/m?    ? ?Wt Readings from Last 3 Encounters:  ?01/15/22 177 lb (80.3 kg)  ?09/08/21 177 lb (80.3 kg)  ?07/24/21 177 lb (80.3 kg)  ?  ? ?GEN:  Well nourished, well developed in no acute distress ?HEENT: Normal ?NECK: No JVD; No carotid bruits ?LYMPHATICS: No lymphadenopathy ?CARDIAC: RRR, no murmurs, rubs, gallops ?RESPIRATORY:  Clear to auscultation without rales, wheezing or rhonchi  ?ABDOMEN: Soft, non-tender, non-distended ?MUSCULOSKELETAL:  No edema; No deformity  ?SKIN: Warm and dry ?NEUROLOGIC:  Alert and oriented x 3 ?PSYCHIATRIC:  Normal affect  ? ?ASSESSMENT:   ? ?1. Primary hypertension   ?2. Aneurysm of ascending aorta without rupture (HCC)   ?3. Pure hypercholesterolemia   ?4. Cough, unspecified type   ? ? ?PLAN:   ? ?In order of problems listed above: ? ?Hypertension, BP elevated but improved.  He has a component of whitecoat syndrome.  Increase amlodipine to 10 milligrams daily, continue losartan 100 mg daily.  Placed 24-hour BP monitor. ?Mild ascending aorta dilatation, monitor with serial imaging/echo. ?Hyperlipidemia, repeat fasting lipid profile. ?Chronic cough, previously leading to syncope.  Refer to pulmonary medicine. ? ?Follow-up in 2 months. ? ? ? ?Medication Adjustments/Labs and Tests Ordered: ?Current medicines are reviewed at length with the patient today.  Concerns regarding medicines are outlined above.  ?Orders Placed This Encounter  ?Procedures  ? Lipid panel  ? Ambulatory referral to Pulmonology  ? 24 hour blood pressure monitor  ? ? ? ?Meds ordered this encounter  ?Medications  ? losartan (COZAAR) 100 MG tablet  ?  Sig: Take 1 tablet (100 mg total) by mouth in the morning and at bedtime.  ?   Dispense:  90 tablet  ?  Refill:  0  ? amLODipine (NORVASC) 10 MG tablet  ?  Sig: Take 1 tablet (10 mg total) by mouth daily.  ?  Dispense:  90 tablet  ?  Refill:  0  ? ? ? ? ?Patient Instructions  ?Medication Instructions:  ? ?Your physician has recommended you make the following change in your medication:  ? ? INCREASE your Amlodipine to 10 MG once a day. ? ?2.    INCREASE your Losartan to 100 MG once a day. ? ? ?*If you need a refill on your cardiac medications before your next appointment, please call your pharmacy* ? ? ?Lab Work: ? ?Your physician recommends that you return  for a FASTING lipid profile: at your earliest convenience. ? ?- You will need to be fasting. Please do not have anything to eat or drink after midnight the morning you have the lab work. You may only have water or black coffee with no cream or sugar.  ? ?Please return to our office on ____________________at______________am/pm ? ? ? ?Testing/Procedures: ? ?A 24 hour BP monitor has been ordered for you. Someone will reach out to you to schedule this. ? ? ?Follow-Up: ?[facility], you and your health needs are our priority.  As part of our continuing mission to provide you with exceptional heart care, we have created designated Provider Care Teams.  These Care Teams include your primary Cardiologist (physician) and Advanced Practice Providers (APPs -  Physician Assistants and Nurse Practitioners) who all work together to provide you with the care you need, when you need it. ? ?We recommend signing up for the patient portal called "MyChart".  Sign up information is provided on this After Visit Summary.  MyChart is used to connect with patients for Virtual Visits (Telemedicine).  Patients are able to view lab/test results, encounter notes, upcoming appointments, etc.  Non-urgent messages can be sent to your provider as well.   ?To learn more about what you can do with MyChart, go to [email or web address].   ? ?Your next appointment:   ?2  month(s) ? ?The format for your next appointment:   ?In Person ? ?Provider:   ?You may see Debbe OdeaBrian Agbor-Etang, MD or one of the following Advanced Practice Providers on your designated Care Team:   ?Nicolasa DuckingChristopher Berge,

## 2022-01-19 ENCOUNTER — Other Ambulatory Visit (INDEPENDENT_AMBULATORY_CARE_PROVIDER_SITE_OTHER): Payer: BC Managed Care – PPO

## 2022-01-19 DIAGNOSIS — E7800 Pure hypercholesterolemia, unspecified: Secondary | ICD-10-CM | POA: Diagnosis not present

## 2022-01-20 ENCOUNTER — Ambulatory Visit (INDEPENDENT_AMBULATORY_CARE_PROVIDER_SITE_OTHER): Payer: BC Managed Care – PPO

## 2022-01-20 DIAGNOSIS — I10 Essential (primary) hypertension: Secondary | ICD-10-CM

## 2022-01-20 DIAGNOSIS — R030 Elevated blood-pressure reading, without diagnosis of hypertension: Secondary | ICD-10-CM | POA: Diagnosis not present

## 2022-01-21 ENCOUNTER — Telehealth: Payer: Self-pay

## 2022-01-21 LAB — LIPID PANEL
Chol/HDL Ratio: 3 ratio (ref 0.0–5.0)
Cholesterol, Total: 268 mg/dL — ABNORMAL HIGH (ref 100–199)
HDL: 88 mg/dL (ref >39)
LDL Chol Calc (NIH): 165 mg/dL — ABNORMAL HIGH (ref 0–99)
Triglycerides: 88 mg/dL (ref 0–149)
VLDL Cholesterol Cal: 15 mg/dL (ref 5–40)

## 2022-01-21 MED ORDER — ATORVASTATIN CALCIUM 40 MG PO TABS: 40.0000 mg | ORAL_TABLET | Freq: Every day | ORAL | 3 refills | Status: DC

## 2022-01-21 NOTE — Telephone Encounter (Signed)
-----   Message from Kate Sable, MD sent at 01/21/2022  2:58 PM EDT ----- ?Cholesterol significantly elevated, start Lipitor 40 mg daily ?

## 2022-01-21 NOTE — Telephone Encounter (Signed)
The patient has been notified of the result and verbalized understanding.  All questions (if any) were answered. Gibson Ramp, RN 01/21/2022 4:27 PM  ? ?

## 2022-02-04 ENCOUNTER — Other Ambulatory Visit: Payer: Self-pay | Admitting: *Deleted

## 2022-02-04 MED ORDER — AMLODIPINE BESYLATE 10 MG PO TABS: 10.0000 mg | ORAL_TABLET | Freq: Every day | ORAL | 0 refills | Status: DC

## 2022-03-04 ENCOUNTER — Ambulatory Visit: Payer: BC Managed Care – PPO | Admitting: Internal Medicine

## 2022-03-04 ENCOUNTER — Encounter: Payer: Self-pay | Admitting: Internal Medicine

## 2022-03-04 VITALS — BP 180/90 | HR 95 | Temp 98.4°F | Ht 68.0 in | Wt 179.0 lb

## 2022-03-04 DIAGNOSIS — U071 COVID-19: Secondary | ICD-10-CM | POA: Diagnosis not present

## 2022-03-04 DIAGNOSIS — R059 Cough, unspecified: Secondary | ICD-10-CM

## 2022-03-04 NOTE — Patient Instructions (Signed)
I do not see any significant lung findings or issues at this time  Consider changing Cozaar blood pressure medications to alternatives and discuss with primary care physician   No indication for chest x-ray or CT scans at this time No indication for antibiotics or prednisone at this time   May consider assessment for sleep apnea at a later point in time  Avoid allergens Avoid smoke Avoid sick contacts

## 2022-03-04 NOTE — Progress Notes (Signed)
[facility] Pulmonary Medicine Consultation      Date: 03/04/2022,   MRN# [id number] Clarence Kirk 1956-06-05   CHIEF COMPLAINT:   History of COVID-19 infection and chronic cough   HISTORY OF PRESENT ILLNESS  66 year old pleasant white male seen today for previous history of chronic cough Patient had COVID-19 infection September 2022 Subsequent cough led to syncopal episode which led to extensive cardiac work-up  At this time patient does not have any respiratory issues Cough has subsided significantly over time  No exacerbation at this time No evidence of heart failure at this time No evidence or signs of infection at this time No respiratory distress No fevers, chills, nausea, vomiting, diarrhea No evidence of lower extremity edema No evidence hemoptysis  Patient does not want to be assessed for sleep apnea at this time Patient does use Cozaar for blood pressure medications If patient has persistent dry cough would recommend changing     PAST MEDICAL HISTORY   No past medical history on file.   SURGICAL HISTORY   Past Surgical History:  Procedure Laterality Date   BASAL CELL CARCINOMA EXCISION     multiple   MELANOMA EXCISION  ~2009   back      FAMILY HISTORY   Family History  Problem Relation Age of Onset   Hypertension Mother    Heart disease Mother    Alzheimer's disease Mother    Dementia Mother    Hypertension Maternal Grandmother    Hypertension Maternal Grandfather    Heart disease Maternal Grandfather    Diabetes Neg Hx    Cancer Neg Hx      SOCIAL HISTORY   Social History   Tobacco Use   Smoking status: Never   Smokeless tobacco: Never  Substance Use Topics   Alcohol use: Yes    Alcohol/week: 0.0 standard drinks    Comment: enjoys red wine     MEDICATIONS    Home Medication:  Current Outpatient Rx   Order #: [id number] Class: Normal   Order #: [id number] Class: Normal   Order #: [id number] Class: Normal   Order #:  [id number] Class: Normal   Order #: [id number] Class: Historical Med   Order #: [id number] Class: Historical Med   Order #: [id number] Class: Historical Med   Order #: [id number] Class: Historical Med    Current Medication:  Current Outpatient Medications:    amLODipine (NORVASC) 10 MG tablet, Take 1 tablet (10 mg total) by mouth daily., Disp: 90 tablet, Rfl: 0   atorvastatin (LIPITOR) 40 MG tablet, Take 1 tablet (40 mg total) by mouth daily., Disp: 30 tablet, Rfl: 3   DULoxetine (CYMBALTA) 30 MG capsule, Take 1 capsule (30 mg total) by mouth daily., Disp: 30 capsule, Rfl: 3   losartan (COZAAR) 100 MG tablet, Take 1 tablet (100 mg total) by mouth in the morning and at bedtime., Disp: 90 tablet, Rfl: 0   Multiple Vitamin (MULTIVITAMIN) capsule, Take 1 capsule by mouth daily., Disp: , Rfl:    Omega-3 Fatty Acids (FISH OIL) 1000 MG CAPS, Take 1 capsule by mouth., Disp: , Rfl:    OVER THE COUNTER MEDICATION, Take 2 Scoops by mouth daily. Beet Powder supplement, Disp: , Rfl:    OVER THE COUNTER MEDICATION, Take 2 tablets by mouth daily. Sambucol elderberry gummies, Disp: , Rfl:     ALLERGIES   Neomycin-bacitracin zn-polymyx and Tape     REVIEW OF SYSTEMS    Review of Systems:  Gen:  Denies  fever, sweats, chills weigh loss  HEENT: Denies blurred vision,  double vision, ear pain, eye pain, hearing loss, nose bleeds, sore throat Cardiac:  No dizziness, chest pain or heaviness, chest tightness,edema Resp:   Denies cough or sputum porduction, shortness of breath,wheezing, hemoptysis,  Other:  All other systems negative   VS: BP (!) 180/90 (BP Location: Left Arm, Cuff Size: Normal)   Pulse 95   Temp 98.4 F (36.9 C) (Temporal)   Ht 5\' 8"  (1.727 m)   Wt 179 lb (81.2 kg)   SpO2 99%   BMI 27.22 kg/m      PHYSICAL EXAM  General Appearance: No distress  EYES PERRLA, EOM intact.   NECK Supple, No JVD Pulmonary: normal breath sounds, No wheezing.  CardiovascularNormal S1,S2.  No m/r/g.    Abdomen: Benign, Soft, non-tender. Skin:   warm, no rashes, no ecchymosis  Extremities: normal, no cyanosis, clubbing. Neuro:without focal findings,  speech normal  PSYCHIATRIC: Mood, affect within normal limits.   ALL OTHER ROS ARE NEGATIVE      IMAGING    No results found.    ASSESSMENT/PLAN   66 year old pleasant white male seen today for previous history of chronic cough most likely related to COVID-19 infection diagnosed in September 2022 Subsequently patient had passing out spell and syncope with excessive coughing had extensive cardiac work-up and no cardiac issues found at this time    I do not see any significant lung findings or issues at this time  Consider changing Cozaar blood pressure medications to alternatives and discuss with primary care physician   No indication for chest x-ray or CT scans at this time No indication for antibiotics or prednisone at this time   May consider assessment for sleep apnea at a later point in time  Avoid allergens Avoid smoke Avoid sick contacts      Patient  satisfied with Plan of action and management. All questions answered  Follow up as needed  Total Time Spent 35 mins   June, M.D.  [facility]  Medical Director [facility] Medical Director [facility] Cardio-Pulmonary Department

## 2022-03-06 IMAGING — CR DG FINGER THUMB 2+V*L*
3 series · 3 of 3 positions shown · non-contrast
Comparison: 07/15/2021

CLINICAL DATA: Post reduction

EXAM:
LEFT THUMB 2+V

[finger ap]
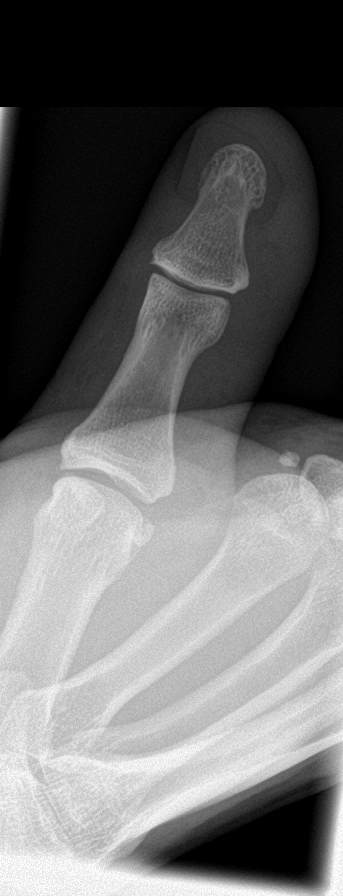

[finger obl]
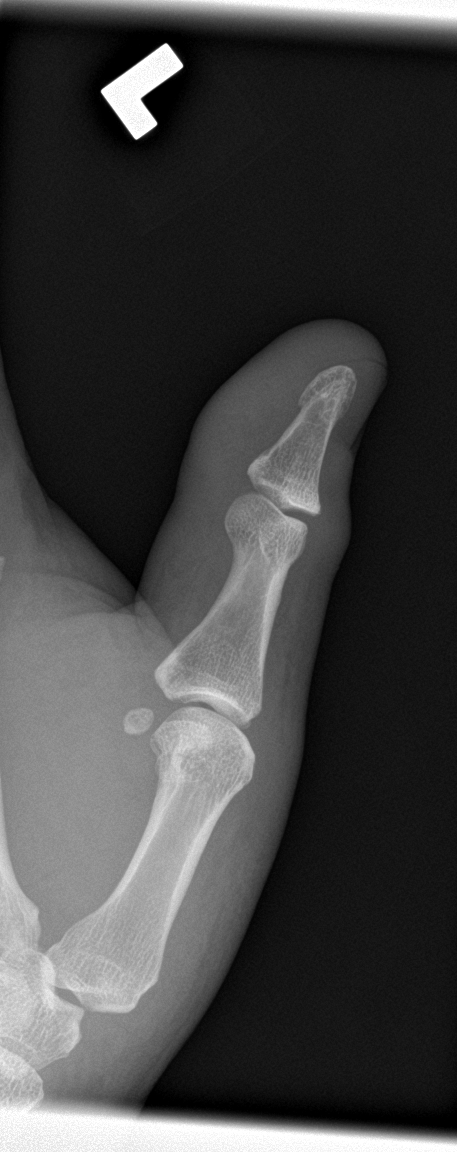

[finger lat]
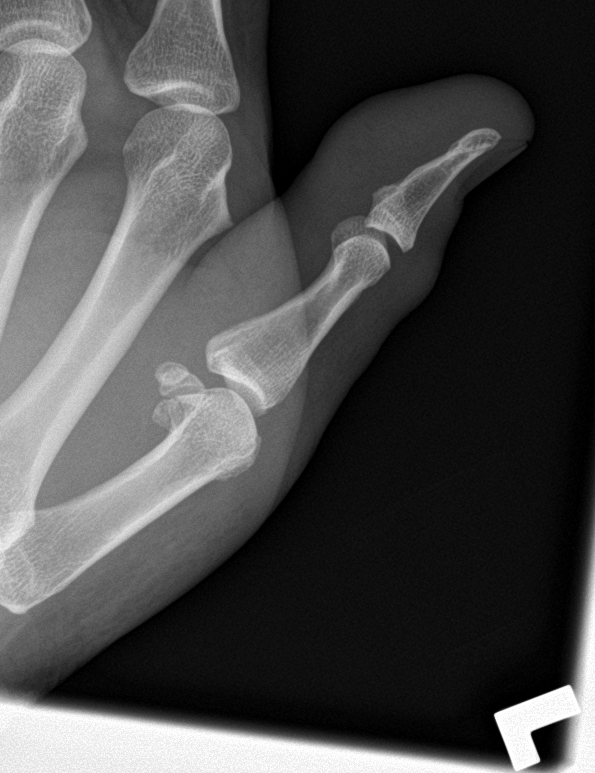

[3 of 3 positions shown; findings below may reference images not displayed]

FINDINGS: Reduction of previously noted IP joint dislocation, now with normal
alignment. No definitive fracture is seen.
IMPRESSION: Reduction of previously noted first IP joint dislocation.

## 2022-03-06 IMAGING — CT CT ANGIO CHEST
2 of 6 series · 17 of 46 positions shown · IV contrast (omnipaque)
Comparison: None.

CLINICAL DATA: PE suspected, high prob

EXAM:
CT ANGIOGRAPHY CHEST WITH CONTRAST
TECHNIQUE: Multidetector CT imaging of the chest was performed using the
standard protocol during bolus administration of intravenous
contrast. Multiplanar CT image reconstructions and MIPs were
obtained to evaluate the vascular anatomy.
CONTRAST:  50mL OMNIPAQUE IOHEXOL 350 MG/ML SOLN

[Series 6: thins · axial · 0.87mm/px · z∈[+1149,+1366]mm · 14 of 239 slices shown]
[im 11/239  lung]
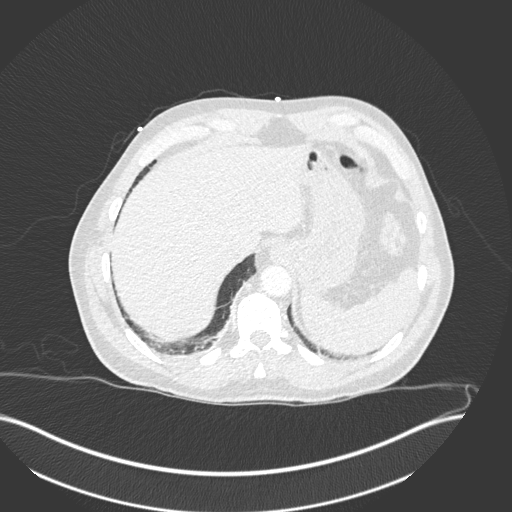
[im 32/239  soft-tissue]
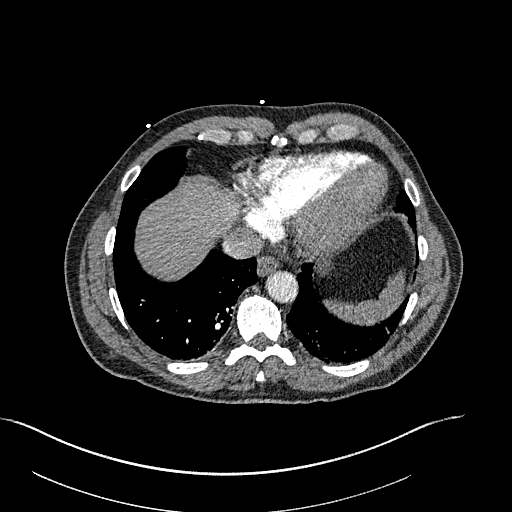
[im 42/239  lung]
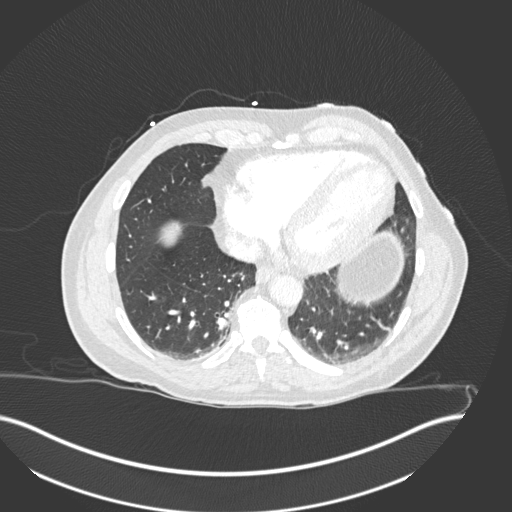
[im 63/239  soft-tissue]
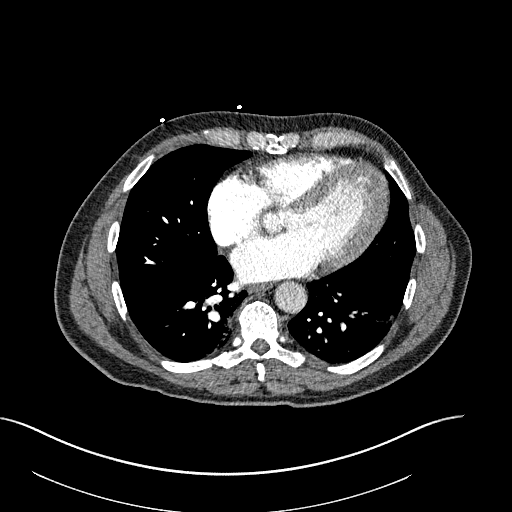
[im 83/239  lung]
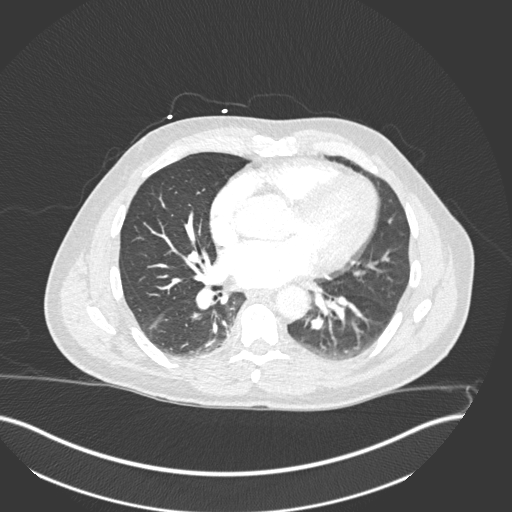
[im 94/239  soft-tissue]
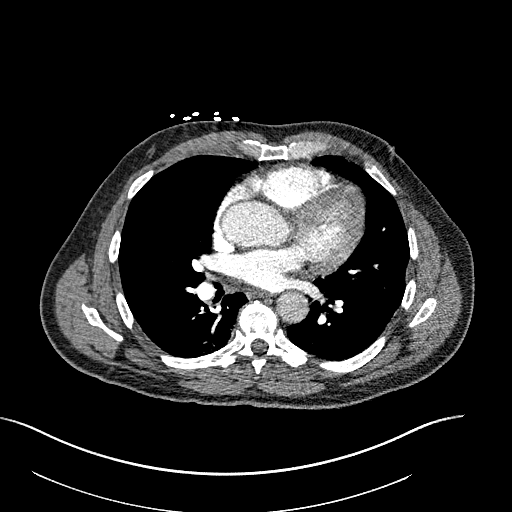
[im 114/239  lung]
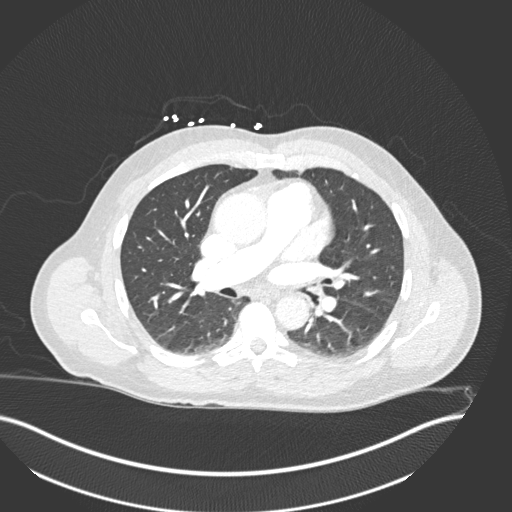
[im 125/239  soft-tissue]
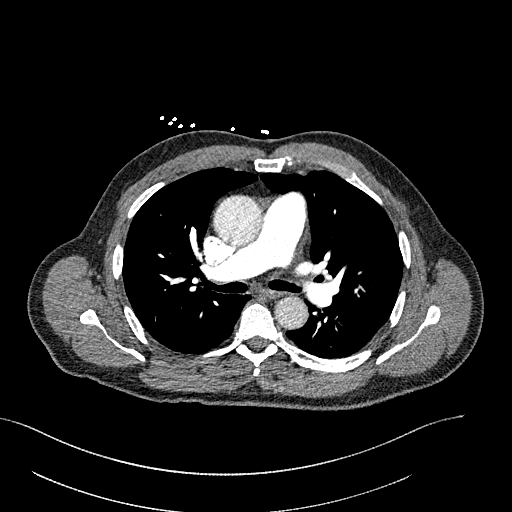
[im 145/239  lung]
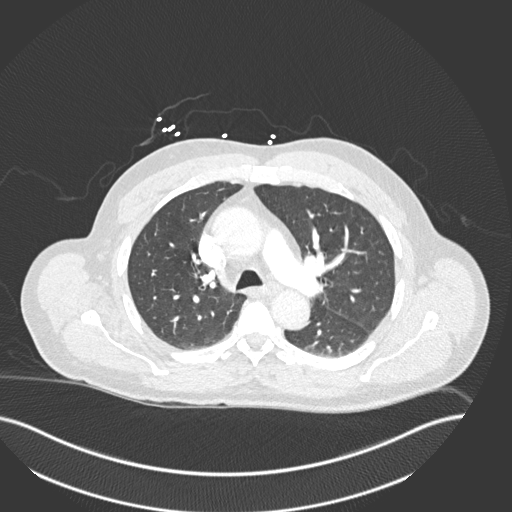
[im 156/239  soft-tissue]
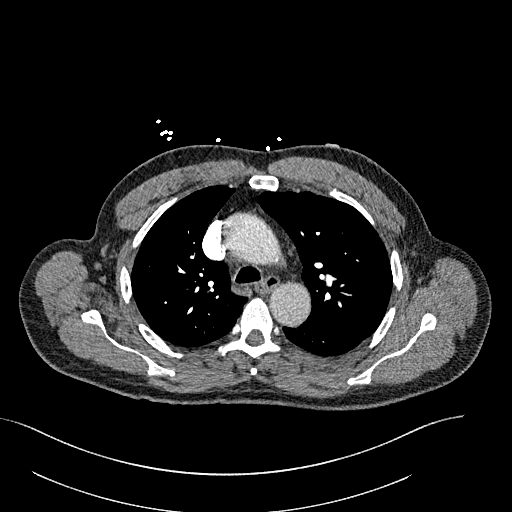
[im 176/239  lung]
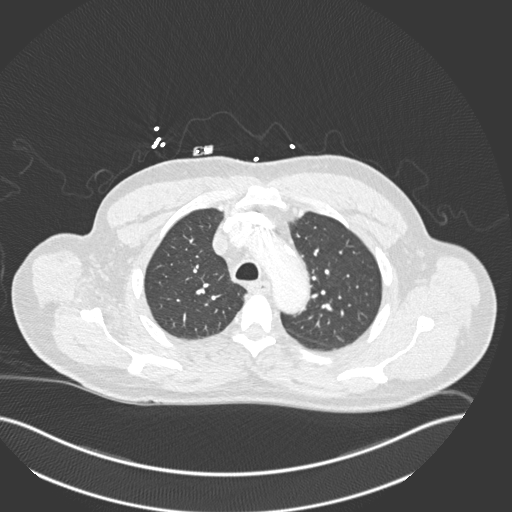
[im 197/239  soft-tissue]
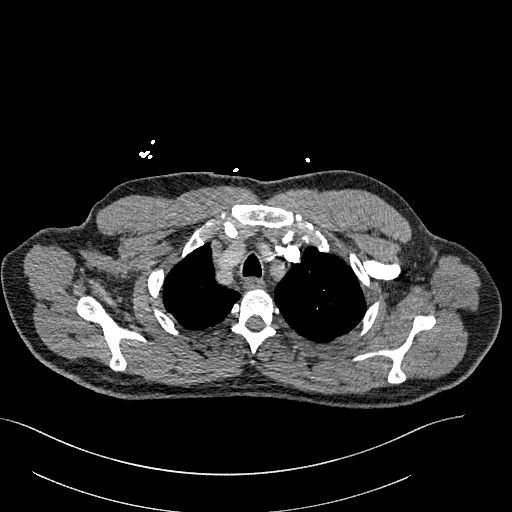
[im 207/239  lung]
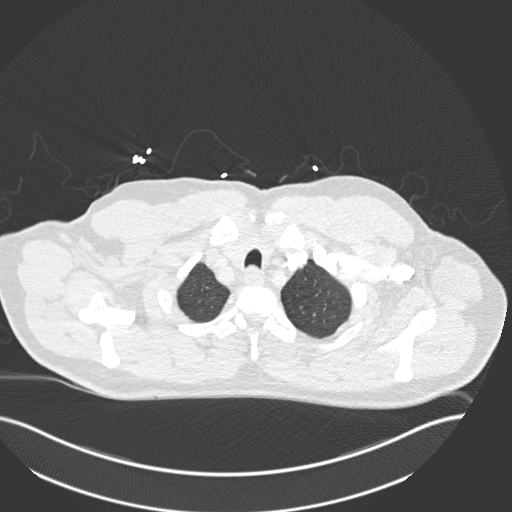
[im 228/239  soft-tissue]
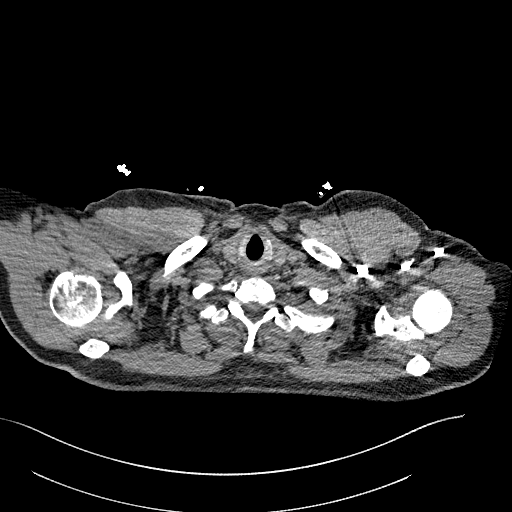

[Series 8: coronal mpr · coronal · 0.48mm/px · 3 of 141 slices shown]
[im 36/141  soft-tissue]
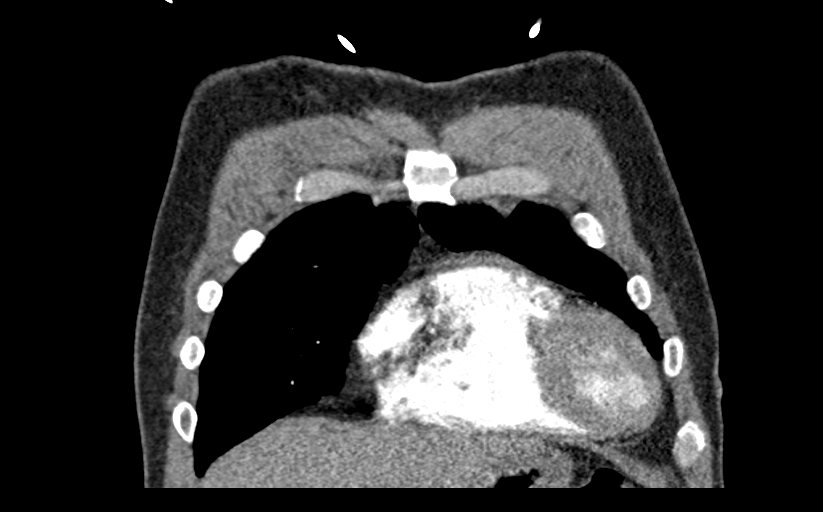
[im 71/141  soft-tissue]
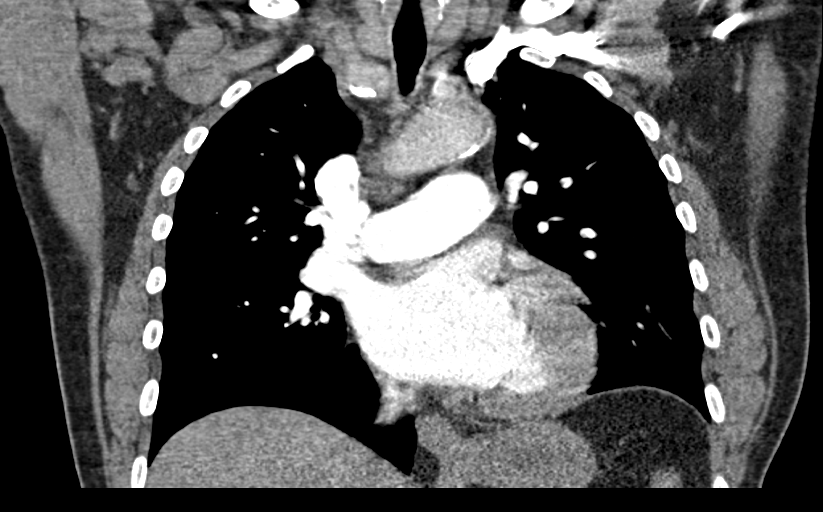
[im 106/141  soft-tissue]
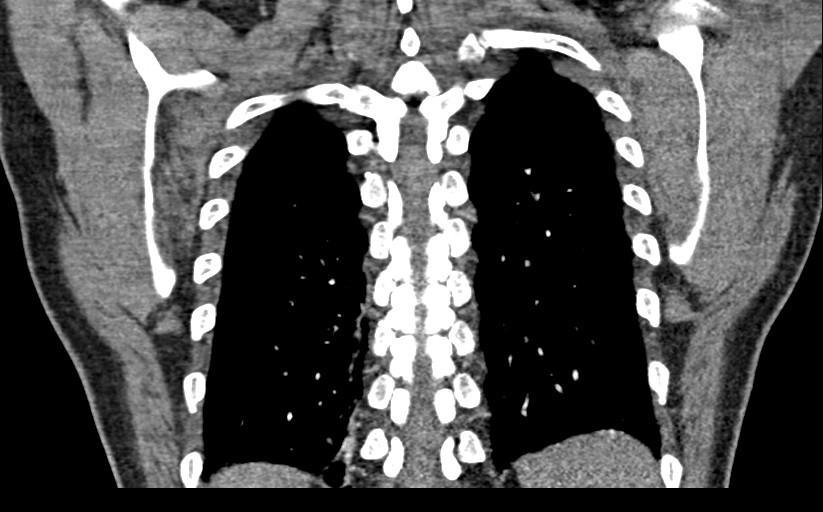

[17 of 46 positions shown; findings below may reference images not displayed]

FINDINGS: Cardiovascular: Limited evaluation of the lung bases secondary to
respiratory motion. Satisfactory opacification of the pulmonary
arteries to the segmental level. No evidence of pulmonary embolism.
Mild cardiomegaly. No pericardial effusion. Atherosclerotic
calcifications of the aorta, mild. Aneurysmal dilation of the
ascending thoracic aorta 4.4 x 4.2 cm. LEFT-sided coronary artery
atherosclerotic calcifications.

Mediastinum/Nodes: Visualized thyroid is unremarkable. No axillary
or mediastinal adenopathy.

Lungs/Pleura: Scattered bibasilar atelectasis. No pleural effusion
or pneumothorax.

Upper Abdomen: No acute abnormality.

Musculoskeletal: No chest wall abnormality. No acute or significant
osseous findings.

Review of the MIP images confirms the above findings.
IMPRESSION: 1. No acute pulmonary embolism.
2. Aneurysmal dilation of the ascending thoracic aorta to 4.4 x
cm. Recommend annual imaging followup by CTA or MRA. This
recommendation follows 7292
ACCF/AHA/AATS/ACR/ASA/SCA/ROBERTO/UYEX/DOPEKIDO/EVELIN Guidelines for the
Diagnosis and Management of Patients with Thoracic Aortic Disease.
Circulation. 7292; 121: E266-e369. Aortic aneurysm NOS (14UMQ-I78.3)

Aortic Atherosclerosis (14UMQ-850.0).

## 2022-03-23 ENCOUNTER — Encounter: Payer: Self-pay | Admitting: Cardiology

## 2022-03-23 ENCOUNTER — Ambulatory Visit: Payer: BC Managed Care – PPO | Admitting: Cardiology

## 2022-03-23 VITALS — BP 154/60 | HR 81 | Ht 68.0 in | Wt 180.0 lb

## 2022-03-23 DIAGNOSIS — I7121 Aneurysm of the ascending aorta, without rupture: Secondary | ICD-10-CM

## 2022-03-23 DIAGNOSIS — I10 Essential (primary) hypertension: Secondary | ICD-10-CM | POA: Diagnosis not present

## 2022-03-23 DIAGNOSIS — E7800 Pure hypercholesterolemia, unspecified: Secondary | ICD-10-CM

## 2022-04-08 ENCOUNTER — Telehealth: Payer: Self-pay | Admitting: Cardiology

## 2022-04-08 DIAGNOSIS — I10 Essential (primary) hypertension: Secondary | ICD-10-CM

## 2022-04-08 MED ORDER — LOSARTAN POTASSIUM 100 MG PO TABS: 100.0000 mg | ORAL_TABLET | Freq: Two times a day (BID) | ORAL | 1 refills | Status: DC

## 2022-04-08 MED ORDER — AMLODIPINE BESYLATE 10 MG PO TABS: 10.0000 mg | ORAL_TABLET | Freq: Every day | ORAL | 1 refills | Status: DC

## 2022-04-08 NOTE — Telephone Encounter (Signed)
*  STAT* If patient is at the pharmacy, call can be transferred to refill team.   1. Which medications need to be refilled? (please list name of each medication and dose if known)  amLODipine (NORVASC) 10 MG tablet losartan (COZAAR) 100 MG tablet  2. Which pharmacy/location (including street and city if local pharmacy) is medication to be sent to? [facility] [id number] - [place], [place] - [address]  3. Do they need a 30 day or 90 day supply? 90 with refills

## 2022-04-08 NOTE — Telephone Encounter (Signed)
Requested Prescriptions   Signed Prescriptions Disp Refills   losartan (COZAAR) 100 MG tablet 180 tablet 1    Sig: Take 1 tablet (100 mg total) by mouth in the morning and at bedtime.    Authorizing Provider: Debbe Odea    Ordering User: NEWCOMER MCCLAIN, Netanya Yazdani L   amLODipine (NORVASC) 10 MG tablet 90 tablet 1    Sig: Take 1 tablet (10 mg total) by mouth daily.    Authorizing Provider: Debbe Odea    Ordering User: Thayer Headings, Mordechai Matuszak L

## 2022-05-26 ENCOUNTER — Other Ambulatory Visit: Payer: Self-pay

## 2022-05-26 MED ORDER — ATORVASTATIN CALCIUM 40 MG PO TABS: 40.0000 mg | ORAL_TABLET | Freq: Every day | ORAL | 4 refills | Status: DC

## 2022-08-06 DIAGNOSIS — H524 Presbyopia: Secondary | ICD-10-CM | POA: Diagnosis not present

## 2022-08-06 DIAGNOSIS — H52203 Unspecified astigmatism, bilateral: Secondary | ICD-10-CM | POA: Diagnosis not present

## 2022-08-06 DIAGNOSIS — H2513 Age-related nuclear cataract, bilateral: Secondary | ICD-10-CM | POA: Diagnosis not present

## 2022-09-17 ENCOUNTER — Ambulatory Visit: Payer: BC Managed Care – PPO | Admitting: Cardiology

## 2022-09-24 ENCOUNTER — Ambulatory Visit: Payer: BC Managed Care – PPO | Admitting: Cardiology

## 2022-10-22 ENCOUNTER — Ambulatory Visit: Payer: BC Managed Care – PPO | Admitting: Cardiology

## 2022-10-26 ENCOUNTER — Other Ambulatory Visit: Payer: Self-pay

## 2022-10-26 MED ORDER — ATORVASTATIN CALCIUM 40 MG PO TABS: 40.0000 mg | ORAL_TABLET | Freq: Every day | ORAL | 0 refills | Status: DC

## 2022-11-11 ENCOUNTER — Encounter: Payer: Self-pay | Admitting: Cardiology

## 2022-11-11 ENCOUNTER — Ambulatory Visit: Payer: Medicare Other | Attending: Cardiology | Admitting: Cardiology

## 2022-11-11 VITALS — BP 198/86 | HR 95 | Ht 68.0 in | Wt 185.0 lb

## 2022-11-11 DIAGNOSIS — E7800 Pure hypercholesterolemia, unspecified: Secondary | ICD-10-CM

## 2022-11-11 DIAGNOSIS — I10 Essential (primary) hypertension: Secondary | ICD-10-CM | POA: Diagnosis not present

## 2022-11-11 DIAGNOSIS — I7121 Aneurysm of the ascending aorta, without rupture: Secondary | ICD-10-CM

## 2022-11-11 MED ORDER — HYDROCHLOROTHIAZIDE 25 MG PO TABS: 25.0000 mg | ORAL_TABLET | Freq: Every day | ORAL | 3 refills | Status: DC

## 2022-11-11 NOTE — Patient Instructions (Signed)
Medication Instructions:   START Hydrochlorothiazide - Take one tablet ( 25 mg) by mouth daily.   *If you need a refill on your cardiac medications before your next appointment, please call your pharmacy*   Lab Work:  Your physician recommends that you return for lab work the day of your Echocardiogram at the medical mall. You will need to be fasting. No appt is needed. Hours are M-F 7AM- 6 PM.  If you have labs (blood work) drawn today and your tests are completely normal, you will receive your results only by: [place] (if you have MyChart) OR A paper copy in the mail If you have any lab test that is abnormal or we need to change your treatment, we will call you to review the results.   Testing/Procedures:  Your physician has requested that you have an echocardiogram. Echocardiography is a painless test that uses sound waves to create images of your heart. It provides your doctor with information about the size and shape of your heart and how well your heart's chambers and valves are working. This procedure takes approximately one hour. There are no restrictions for this procedure. Please do NOT wear cologne, perfume, aftershave, or lotions (deodorant is allowed). Please arrive 15 minutes prior to your appointment time.    Follow-Up: At [facility], you and your health needs are our priority.  As part of our continuing mission to provide you with exceptional heart care, we have created designated Provider Care Teams.  These Care Teams include your primary Cardiologist (physician) and Advanced Practice Providers (APPs -  Physician Assistants and Nurse Practitioners) who all work together to provide you with the care you need, when you need it.  We recommend signing up for the patient portal called "MyChart".  Sign up information is provided on this After Visit Summary.  MyChart is used to connect with patients for Virtual Visits (Telemedicine).  Patients are able to view  lab/test results, encounter notes, upcoming appointments, etc.  Non-urgent messages can be sent to your provider as well.   To learn more about what you can do with MyChart, go to [email or web address].    Your next appointment:    After Echocardiogram  Provider:   You may see Kate Sable, MD or one of the following Advanced Practice Providers on your designated Care Team:   Murray Hodgkins, NP Christell Faith, PA-C Cadence Kathlen Mody, PA-C Gerrie Nordmann, NP

## 2022-11-11 NOTE — Progress Notes (Signed)
Cardiology Office Note:    Date:  11/11/2022   ID:  Clarence Kirk, DOB [birth year], MRN [id number]  PCP:  Venia Carbon, MD   [facility] Providers Cardiologist:  Kate Sable, MD     Referring MD: Venia Carbon, MD   No chief complaint on file.   History of Present Illness:    Clarence Kirk is a 67 y.o. male with a hx of hypertension, hyperlipidemia, mild ascending aorta dilatation who presents for follow-up.   Being seen for hypertension, BP meds previously titrated, blood pressure at home ranging in the [id number] to [id number] systolic.  Denies chest pain, exercises frequently with no adverse effects.  States eating low-salt, healthy diet.  Compliant with medications as prescribed.  Prior notes Echo 07/2021 EF 60 to 65%, ascending aorta 43 mm Cardiac monitor 07/2021 occasional paroxysmal SVT, no significant arrhythmias Chest CT obtained 06/2021 showed ascending aorta measuring 4.4 x 4.2 cm.  History reviewed. No pertinent past medical history.  Past Surgical History:  Procedure Laterality Date   BASAL CELL CARCINOMA EXCISION     multiple   MELANOMA EXCISION  ~2009   back     Current Medications: Current Meds  Medication Sig   amLODipine (NORVASC) 10 MG tablet Take 1 tablet (10 mg total) by mouth daily.   atorvastatin (LIPITOR) 40 MG tablet Take 1 tablet (40 mg total) by mouth daily.   DULoxetine (CYMBALTA) 30 MG capsule Take 1 capsule (30 mg total) by mouth daily.   hydrochlorothiazide (HYDRODIURIL) 25 MG tablet Take 1 tablet (25 mg total) by mouth daily.   Multiple Vitamin (MULTIVITAMIN) capsule Take 1 capsule by mouth daily.   Omega-3 Fatty Acids (FISH OIL) 1000 MG CAPS Take 1 capsule by mouth.   OVER THE COUNTER MEDICATION Take 2 Scoops by mouth daily. Beet Powder supplement   OVER THE COUNTER MEDICATION Take 2 tablets by mouth daily. 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[employer]  Tobacco Use   Smoking status: Never   Smokeless tobacco: Never  Vaping Use   Vaping Use: Never used  Substance and Sexual Activity   Alcohol use: Yes    Alcohol/week: 0.0 standard drinks of alcohol    Comment: enjoys red wine   Drug use: Not on file   Sexual activity: Not on file  Other Topics Concern   Not on file  Social History Narrative   Not on file   Social Determinants of Health   Financial Resource Strain: Not on file  Food Insecurity: Not on file  Transportation Needs: Not on file  Physical Activity: Not on file  Stress: Not on file  Social Connections: Not on file     Family History: The patient's family history includes Alzheimer's disease in his mother; Dementia in his mother; Heart disease in his maternal grandfather and mother; Hypertension in his maternal grandfather, maternal grandmother, and mother. There is no history of Diabetes or Cancer.  ROS:   Please see the history of present illness.     All other systems reviewed and are negative.  EKGs/Labs/Other Studies Reviewed:    The following studies were reviewed today:   EKG:  EKG is  ordered today.  EKG shows normal sinus rhythm  Recent Labs: No results found for requested labs within last 365 days.  Recent Lipid Panel    Component Value Date/Time   CHOL 268 (H) 01/19/2022 1033   TRIG 88 01/19/2022 1033   HDL 88 01/19/2022 1033   CHOLHDL 3.0 01/19/2022 1033   CHOLHDL 3 12/02/2017 0851   VLDL 18.0 12/02/2017 0851   LDLCALC 165 (H) 01/19/2022 1033   LDLDIRECT 150.7 06/25/2010 1233     Risk Assessment/Calculations:          Physical Exam:    VS:  BP (!) 198/86   Pulse 95   Ht 5' 8"$  (1.727 m)   Wt 185 lb (83.9 kg)   BMI 28.13 kg/m     Wt Readings from Last 3  Encounters:  11/11/22 185 lb (83.9 kg)  03/23/22 180 lb (81.6 kg)  03/04/22 179 lb (81.2 kg)     GEN:  Well nourished, well developed in no acute distress HEENT: Normal NECK: No JVD; No carotid bruits LYMPHATICS: No lymphadenopathy CARDIAC: RRR, no murmurs, rubs, gallops RESPIRATORY:  Clear to auscultation without rales, wheezing or rhonchi  ABDOMEN: Soft, non-tender, non-distended MUSCULOSKELETAL:  No edema; No deformity  SKIN: Warm and dry NEUROLOGIC:  Alert and oriented x 3 PSYCHIATRIC:  Normal affect   ASSESSMENT:    1. Primary hypertension   2. Pure hypercholesterolemia   3. Aneurysm of ascending aorta without rupture (HCC)    PLAN:    In order of problems listed above:  Hypertension, BP elevated.  He has a component of whitecoat syndrome, BPs at home ranging 120s to 140s.  Start HCTZ 25 mg daily, continue losartan 100 mg daily, Norvasc 10 mg daily. Hyperlipidemia, continue Lipitor 40 mg daily.  Obtain fasting lipid profile Mild ascending aorta dilatation measuring 43 mm, repeat echo.  BP management as above.  Follow-up in 6 to 8 weeks   Medication Adjustments/Labs and Tests Ordered: Current medicines are reviewed at length with the patient today.  Concerns regarding medicines are outlined above.  Orders Placed This Encounter  Procedures   Lipid panel   EKG 12-Lead   ECHOCARDIOGRAM COMPLETE     Meds ordered this encounter  Medications   hydrochlorothiazide (HYDRODIURIL) 25 MG tablet    Sig: Take 1 tablet (25 mg total) by mouth daily.    Dispense:  90 tablet    Refill:  3      Patient Instructions  Medication Instructions:   START Hydrochlorothiazide - Take one tablet ( 25 mg) by mouth daily.   *If you need a refill on your cardiac medications before your next appointment, please call your pharmacy*   Lab Work:  Your physician recommends that you return for lab work the day of your Echocardiogram at the medical mall. You will need to be fasting. No  appt is needed. Hours are M-F 7AM- 6 PM.  If you have labs (blood work) drawn today and your tests are completely normal, you will receive your results only by: [place] (if you have MyChart) OR A paper copy in the mail If you have any lab test that is abnormal or we need to change your treatment, we will call you to review the results.   Testing/Procedures:  Your physician has requested that you have an echocardiogram. Echocardiography is a painless test that uses sound waves to create images of your heart. It provides your doctor with information about the size and shape of your heart and  how well your heart's chambers and valves are working. This procedure takes approximately one hour. There are no restrictions for this procedure. Please do NOT wear cologne, perfume, aftershave, or lotions (deodorant is allowed). Please arrive 15 minutes prior to your appointment time.    Follow-Up: At [facility], you and your health needs are our priority.  As part of our continuing mission to provide you with exceptional heart care, we have created designated Provider Care Teams.  These Care Teams include your primary Cardiologist (physician) and Advanced Practice Providers (APPs -  Physician Assistants and Nurse Practitioners) who all work together to provide you with the care you need, when you need it.  We recommend signing up for the patient portal called "MyChart".  Sign up information is provided on this After Visit Summary.  MyChart is used to connect with patients for Virtual Visits (Telemedicine).  Patients are able to view lab/test results, encounter notes, upcoming appointments, etc.  Non-urgent messages can be sent to your provider as well.   To learn more about what you can do with MyChart, go to [email or web address].    Your next appointment:    After Echocardiogram  Provider:   You may see Kate Sable, MD or one of the following Advanced Practice Providers on  your designated Care Team:   Murray Hodgkins, NP Christell Faith, PA-C Cadence Kathlen Mody, PA-C Gerrie Nordmann, NP   Signed, Kate Sable, MD  11/11/2022 10:07 AM    [place]

## 2022-11-18 ENCOUNTER — Other Ambulatory Visit: Payer: Self-pay

## 2022-11-18 DIAGNOSIS — I10 Essential (primary) hypertension: Secondary | ICD-10-CM

## 2022-11-18 MED ORDER — LOSARTAN POTASSIUM 100 MG PO TABS: 100.0000 mg | ORAL_TABLET | Freq: Two times a day (BID) | ORAL | 3 refills | Status: DC

## 2022-11-18 MED ORDER — ATORVASTATIN CALCIUM 40 MG PO TABS: 40.0000 mg | ORAL_TABLET | Freq: Every day | ORAL | 3 refills | Status: DC

## 2022-12-28 ENCOUNTER — Other Ambulatory Visit
Admission: RE | Admit: 2022-12-28 | Discharge: 2022-12-28 | Disposition: A | Discharge status: Home / Self Care | Payer: Medicare Other | Attending: Cardiology | Admitting: Cardiology

## 2022-12-28 ENCOUNTER — Ambulatory Visit: Payer: Medicare Other | Attending: Cardiology

## 2022-12-28 DIAGNOSIS — I7121 Aneurysm of the ascending aorta, without rupture: Secondary | ICD-10-CM

## 2022-12-28 LAB — ECHOCARDIOGRAM COMPLETE
AR max vel: 1.98 cm2
AV Area VTI: 2.31 cm2
AV Area mean vel: 1.83 cm2
AV Mean grad: 6 mmHg
AV Peak grad: 12.8 mmHg
Ao pk vel: 1.79 m/s
S' Lateral: 3.3 cm

## 2022-12-28 LAB — LIPID PANEL
Cholesterol: 193 mg/dL (ref 0–200)
HDL: 94 mg/dL (ref >40)
LDL Cholesterol: 84 mg/dL (ref 0–99)
Total CHOL/HDL Ratio: 2.1 RATIO
Triglycerides: 73 mg/dL (ref <150)
VLDL: 15 mg/dL (ref 0–40)

## 2023-01-07 ENCOUNTER — Ambulatory Visit: Payer: Medicare Other | Attending: Cardiology | Admitting: Cardiology

## 2023-01-07 ENCOUNTER — Encounter: Payer: Self-pay | Admitting: Cardiology

## 2023-01-07 VITALS — BP 142/78 | HR 99 | Ht 67.0 in | Wt 183.2 lb

## 2023-01-07 DIAGNOSIS — I7121 Aneurysm of the ascending aorta, without rupture: Secondary | ICD-10-CM | POA: Diagnosis not present

## 2023-01-07 DIAGNOSIS — E7800 Pure hypercholesterolemia, unspecified: Secondary | ICD-10-CM

## 2023-01-07 DIAGNOSIS — I10 Essential (primary) hypertension: Secondary | ICD-10-CM | POA: Diagnosis not present

## 2023-01-07 NOTE — Patient Instructions (Signed)
Medication Instructions:   Your physician recommends that you continue on your current medications as directed. Please refer to the Current Medication list given to you today.  *If you need a refill on your cardiac medications before your next appointment, please call your pharmacy*   Lab Work:  None Ordered  If you have labs (blood work) drawn today and your tests are completely normal, you will receive your results only by: MyChart Message (if you have MyChart) OR A paper copy in the mail If you have any lab test that is abnormal or we need to change your treatment, we will call you to review the results.   Testing/Procedures:  None Ordered   Follow-Up: At [facility], you and your health needs are our priority.  As part of our continuing mission to provide you with exceptional heart care, we have created designated Provider Care Teams.  These Care Teams include your primary Cardiologist (physician) and Advanced Practice Providers (APPs -  Physician Assistants and Nurse Practitioners) who all work together to provide you with the care you need, when you need it.  We recommend signing up for the patient portal called "MyChart".  Sign up information is provided on this After Visit Summary.  MyChart is used to connect with patients for Virtual Visits (Telemedicine).  Patients are able to view lab/test results, encounter notes, upcoming appointments, etc.  Non-urgent messages can be sent to your provider as well.   To learn more about what you can do with MyChart, go to [email or web address].    Your next appointment:   6 month(s)  Provider:   You may see Brian Agbor-Etang, MD or one of the following Advanced Practice Providers on your designated Care Team:   Christopher Berge, NP Ryan Dunn, PA-C Cadence Furth, PA-C Sheri Hammock, NP 

## 2023-01-07 NOTE — Progress Notes (Signed)
Cardiology Office Note:    Date:  01/07/2023   ID:  Annett Gula, DOB [birth year], MRN [id number]  PCP:  Karie Schwalbe, MD   [facility] Providers Cardiologist:  Debbe Odea, MD     Referring MD: Karie Schwalbe, MD   Chief Complaint  Patient presents with   Follow-up    Testing f/u, no new cardiac concerns    History of Present Illness:    Clarence Kirk is a 67 y.o. male with a hx of hypertension, hyperlipidemia, mild ascending aorta dilatation (70mm) who presents for follow-up.   Being seen for hypertension, ascending aortic dilatation.  He eats low-salt diet, exercises frequently without any symptoms.  Recent cholesterol check was adequately controlled.  Blood pressure is controlled when he is relaxed and not stressed.  Systolic around 117.  Compliant with medications as prescribed.  No new concerns at this time.  Prior notes Echo 12/2022 EF 60 to 65%, ascending aorta 43 mm Echo 07/2021 EF 60 to 65%, ascending aorta 43 mm Cardiac monitor 07/2021 occasional paroxysmal SVT, no significant arrhythmias Chest CT obtained 06/2021 showed ascending aorta measuring 4.4 x 4.2 cm.  History reviewed. No pertinent past medical history.  Past Surgical History:  Procedure Laterality Date   BASAL CELL CARCINOMA EXCISION     multiple   MELANOMA EXCISION  ~2009   back     Current Medications: Current Meds  Medication Sig   amLODipine (NORVASC) 10 MG tablet Take 1 tablet (10 mg total) by mouth daily.   atorvastatin (LIPITOR) 40 MG tablet Take 1 tablet (40 mg total) by mouth daily.   DULoxetine (CYMBALTA) 30 MG capsule Take 1 capsule (30 mg total) by mouth daily.   hydrochlorothiazide (HYDRODIURIL) 25 MG tablet Take 1 tablet (25 mg total) by mouth daily.   losartan (COZAAR) 100 MG tablet Take 1 tablet (100 mg total) by mouth in the morning and at bedtime.   Multiple Vitamin (MULTIVITAMIN) capsule Take 1 capsule by mouth daily.   Omega-3 Fatty Acids (FISH OIL) 1000 MG  CAPS Take 1 capsule by mouth.   OVER THE COUNTER MEDICATION Take 2 Scoops by mouth daily. Beet Powder supplement   OVER THE COUNTER MEDICATION Take 2 tablets by mouth daily. 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[employer]  Tobacco Use   Smoking status: Never   Smokeless tobacco: Never  Vaping Use   Vaping Use: Never used  Substance and Sexual Activity   Alcohol use: Yes    Alcohol/week: 0.0 standard drinks of alcohol    Comment: enjoys red wine   Drug use: Not on file   Sexual activity: Not on file  Other Topics Concern   Not on file  Social History Narrative   Not on file   Social Determinants of Health   Financial Resource Strain: Not on file  Food Insecurity: Not on file  Transportation Needs: Not on file  Physical Activity: Not on file  Stress: Not on file  Social Connections: Not on file     Family History: The patient's family history includes Alzheimer's disease in his mother; Dementia in his mother; Heart disease in his maternal grandfather and mother;  Hypertension in his maternal grandfather, maternal grandmother, and mother. There is no history of Diabetes or Cancer.  ROS:   Please see the history of present illness.     All other systems reviewed and are negative.  EKGs/Labs/Other Studies Reviewed:    The following studies were reviewed today:   EKG:  EKG not ordered today.    Recent Labs: No results found for requested labs within last 365 days.  Recent Lipid Panel    Component Value Date/Time   CHOL 193 12/28/2022 0814   CHOL 268 (H) 01/19/2022 1033   TRIG 73 12/28/2022 0814   HDL 94 12/28/2022 0814   HDL 88 01/19/2022 1033   CHOLHDL 2.1 12/28/2022 0814    VLDL 15 12/28/2022 0814   LDLCALC 84 12/28/2022 0814   LDLCALC 165 (H) 01/19/2022 1033   LDLDIRECT 150.7 06/25/2010 1233     Risk Assessment/Calculations:          Physical Exam:    VS:  BP (!) 142/78 (BP Location: Left Arm)   Pulse 99   Ht 5\' 7"  (1.702 m)   Wt 183 lb 3.2 oz (83.1 kg)   SpO2 99%   BMI 28.69 kg/m     Wt Readings from Last 3 Encounters:  01/07/23 183 lb 3.2 oz (83.1 kg)  11/11/22 185 lb (83.9 kg)  03/23/22 180 lb (81.6 kg)     GEN:  Well nourished, well developed in no acute distress HEENT: Normal NECK: No JVD; No carotid bruits LYMPHATICS: No lymphadenopathy CARDIAC: RRR, no murmurs, rubs, gallops RESPIRATORY:  Clear to auscultation without rales, wheezing or rhonchi  ABDOMEN: Soft, non-tender, non-distended MUSCULOSKELETAL:  No edema; No deformity  SKIN: Warm and dry NEUROLOGIC:  Alert and oriented x 3 PSYCHIATRIC:  Normal affect   ASSESSMENT:    1. White coat syndrome with diagnosis of hypertension   2. Pure hypercholesterolemia   3. Aneurysm of ascending aorta without rupture     PLAN:    In order of problems listed above:  Hypertension, BP elevated.  He has a component of whitecoat syndrome, BPs at home ranging 120s to 140s.  Continue HCTZ 25 mg daily, losartan 100 mg daily, Norvasc 10 mg daily. Hyperlipidemia, cholesterol controlled.  Continue Lipitor 40 mg daily.   Mild ascending aorta dilatation measuring 43 mm, repeat echo 12/2022 with stable ascending aorta dilatation measuring 43 mm.  Follow-up in 6 months.   Medication Adjustments/Labs and Tests Ordered: Current medicines are reviewed at length with the patient today.  Concerns regarding medicines are outlined above.  No orders of the defined types were placed in this encounter.    No orders of the defined types were placed in this encounter.     Patient Instructions  Medication Instructions:   Your physician recommends that you continue on your current medications as  directed. Please refer to the Current Medication list given to you today.  *If you need a refill on your cardiac medications before your next appointment, please call your pharmacy*   Lab Work:  None Ordered  If you have labs (blood work) drawn today and your tests are completely normal, you will receive your results only by: MyChart Message (if you have MyChart) OR A paper copy in the mail If you have any lab test that is abnormal or we need to change your treatment, we will call you to review the results.   Testing/Procedures:  None Ordered   Follow-Up: At [facility], you and your health needs  are our priority.  As part of our continuing mission to provide you with exceptional heart care, we have created designated Provider Care Teams.  These Care Teams include your primary Cardiologist (physician) and Advanced Practice Providers (APPs -  Physician Assistants and Nurse Practitioners) who all work together to provide you with the care you need, when you need it.  We recommend signing up for the patient portal called "MyChart".  Sign up information is provided on this After Visit Summary.  MyChart is used to connect with patients for Virtual Visits (Telemedicine).  Patients are able to view lab/test results, encounter notes, upcoming appointments, etc.  Non-urgent messages can be sent to your provider as well.   To learn more about what you can do with MyChart, go to [email or web address].    Your next appointment:   6 month(s)  Provider:   You may see Debbe OdeaBrian Agbor-Etang, MD or one of the following Advanced Practice Providers on your designated Care Team:   Nicolasa Duckinghristopher Berge, NP Eula Listenyan Dunn, PA-C Cadence Fransico MichaelFurth, PA-C Charlsie QuestSheri Hammock, NP    Signed, Debbe OdeaBrian Agbor-Etang, MD  01/07/2023 10:27 AM    [facility]

## 2023-01-12 ENCOUNTER — Other Ambulatory Visit: Payer: Self-pay

## 2023-01-12 MED ORDER — AMLODIPINE BESYLATE 10 MG PO TABS: 10.0000 mg | ORAL_TABLET | Freq: Every day | ORAL | 1 refills | Status: DC

## 2023-07-14 ENCOUNTER — Other Ambulatory Visit: Payer: Self-pay

## 2023-07-14 MED ORDER — AMLODIPINE BESYLATE 10 MG PO TABS: 10.0000 mg | ORAL_TABLET | Freq: Every day | ORAL | 0 refills | Status: DC

## 2023-07-20 ENCOUNTER — Telehealth: Payer: Self-pay | Admitting: Cardiology

## 2023-07-20 NOTE — Telephone Encounter (Signed)
-----   Message from [facility] Clarence Kirk C sent at 07/14/2023  4:01 PM EDT ----- Please contact patient for a follow up appointment. The patient has a recall for October 2024. Thanks, Clarence Kirk

## 2023-07-20 NOTE — Telephone Encounter (Signed)
Left message on voice mail to schedule

## 2023-08-12 ENCOUNTER — Other Ambulatory Visit: Payer: Self-pay

## 2023-08-12 MED ORDER — AMLODIPINE BESYLATE 10 MG PO TABS: 10.0000 mg | ORAL_TABLET | Freq: Every day | ORAL | 0 refills | Status: DC

## 2023-08-19 DIAGNOSIS — H5203 Hypermetropia, bilateral: Secondary | ICD-10-CM | POA: Diagnosis not present

## 2023-08-19 DIAGNOSIS — H2513 Age-related nuclear cataract, bilateral: Secondary | ICD-10-CM | POA: Diagnosis not present

## 2023-09-06 ENCOUNTER — Ambulatory Visit: Payer: Medicare Other | Admitting: Cardiology

## 2023-09-14 ENCOUNTER — Other Ambulatory Visit: Payer: Self-pay

## 2023-09-14 MED ORDER — AMLODIPINE BESYLATE 10 MG PO TABS: 10.0000 mg | ORAL_TABLET | Freq: Every day | ORAL | 1 refills | Status: DC

## 2023-10-27 ENCOUNTER — Ambulatory Visit: Payer: Medicare Other | Admitting: Cardiology

## 2023-11-02 DIAGNOSIS — C44311 Basal cell carcinoma of skin of nose: Secondary | ICD-10-CM | POA: Diagnosis not present

## 2023-11-02 DIAGNOSIS — C4401 Basal cell carcinoma of skin of lip: Secondary | ICD-10-CM | POA: Diagnosis not present

## 2023-11-02 DIAGNOSIS — D485 Neoplasm of uncertain behavior of skin: Secondary | ICD-10-CM | POA: Diagnosis not present

## 2023-11-02 DIAGNOSIS — C44319 Basal cell carcinoma of skin of other parts of face: Secondary | ICD-10-CM | POA: Diagnosis not present

## 2023-11-15 ENCOUNTER — Other Ambulatory Visit: Payer: Self-pay

## 2023-11-15 DIAGNOSIS — I10 Essential (primary) hypertension: Secondary | ICD-10-CM

## 2023-11-15 MED ORDER — ATORVASTATIN CALCIUM 40 MG PO TABS: 40.0000 mg | ORAL_TABLET | Freq: Every day | ORAL | 0 refills | Status: DC

## 2023-11-15 MED ORDER — HYDROCHLOROTHIAZIDE 25 MG PO TABS
25.0000 mg | ORAL_TABLET | Freq: Every day | ORAL | 0 refills | Status: DC
Start: 2023-11-15 — End: 2023-12-16

## 2023-11-15 MED ORDER — AMLODIPINE BESYLATE 10 MG PO TABS: 10.0000 mg | ORAL_TABLET | Freq: Every day | ORAL | 0 refills | Status: DC

## 2023-11-15 MED ORDER — LOSARTAN POTASSIUM 100 MG PO TABS: 100.0000 mg | ORAL_TABLET | Freq: Two times a day (BID) | ORAL | 0 refills | Status: DC

## 2023-11-15 NOTE — Telephone Encounter (Addendum)
Requested Prescriptions   Signed Prescriptions Disp Refills   atorvastatin (LIPITOR) 40 MG tablet 30 tablet 0    Sig: Take 1 tablet (40 mg total) by mouth daily.    Authorizing Provider: Debbe Odea    Ordering User: Feliberto Harts L   losartan (COZAAR) 100 MG tablet 60 tablet 0    Sig: Take 1 tablet (100 mg total) by mouth in the morning and at bedtime.    Authorizing Provider: Debbe Odea    Ordering User: Guerry Minors   hydrochlorothiazide (HYDRODIURIL) 25 MG tablet 30 tablet 0    Sig: Take 1 tablet (25 mg total) by mouth daily.    Authorizing Provider: Debbe Odea    Ordering User: Feliberto Harts L   amLODipine (NORVASC) 10 MG tablet 30 tablet 0    Sig: Take 1 tablet (10 mg total) by mouth daily.    Authorizing Provider: Debbe Odea    Ordering User: Guerry Minors

## 2023-11-15 NOTE — Addendum Note (Signed)
Addended by: Guerry Minors on: 11/15/2023 03:08 PM   Modules accepted: Orders

## 2023-11-25 ENCOUNTER — Ambulatory Visit: Payer: Medicare Other | Attending: Cardiology | Admitting: Cardiology

## 2023-11-25 VITALS — BP 172/82 | HR 109 | Ht 69.0 in | Wt 187.0 lb

## 2023-11-25 DIAGNOSIS — I77810 Thoracic aortic ectasia: Secondary | ICD-10-CM | POA: Diagnosis not present

## 2023-11-25 DIAGNOSIS — E7800 Pure hypercholesterolemia, unspecified: Secondary | ICD-10-CM

## 2023-11-25 DIAGNOSIS — I10 Essential (primary) hypertension: Secondary | ICD-10-CM

## 2023-11-25 NOTE — Progress Notes (Signed)
 Cardiology Office Note:    Date:  11/25/2023   ID:  Clarence Kirk, DOB 1956-04-20, MRN [id number]  PCP:  Karie Schwalbe, MD   [facility] Providers Cardiologist:  Debbe Odea, MD     Referring MD: Karie Schwalbe, MD   No chief complaint on file.   History of Present Illness:    Clarence Kirk is a 68 y.o. male with a hx of hypertension, hyperlipidemia, mild ascending aorta dilatation (43mm) who presents for follow-up.   Feels well, denies chest pain or shortness of breath.  Exercises frequently, recently join boxing gym.  Blood pressures at home are adequately controlled with systolics in the low 100s to 120s.  Overall feels well, no concerns at this time.   Prior notes Echo 12/2022 EF 60 to 65%, ascending aorta 43 mm Echo 07/2021 EF 60 to 65%, ascending aorta 43 mm Cardiac monitor 07/2021 occasional paroxysmal SVT, no significant arrhythmias Chest CT obtained 06/2021 showed ascending aorta measuring 4.4 x 4.2 cm.  No past medical history on file.  Past Surgical History:  Procedure Laterality Date   BASAL CELL CARCINOMA EXCISION     multiple   MELANOMA EXCISION  ~2009   back     Current Medications: Current Meds  Medication Sig   amLODipine (NORVASC) 10 MG tablet Take 1 tablet (10 mg total) by mouth daily.   atorvastatin (LIPITOR) 40 MG tablet Take 1 tablet (40 mg total) by mouth daily.   DULoxetine (CYMBALTA) 30 MG capsule Take 1 capsule (30 mg total) by mouth daily.   hydrochlorothiazide (HYDRODIURIL) 25 MG tablet Take 1 tablet (25 mg total) by mouth daily.   losartan (COZAAR) 100 MG tablet Take 1 tablet (100 mg total) by mouth in the morning and at bedtime.   Multiple Vitamin (MULTIVITAMIN) capsule Take 1 capsule by mouth daily.   Omega-3 Fatty Acids (FISH OIL) 1000 MG CAPS Take 1 capsule by mouth.   OVER THE COUNTER MEDICATION Take 2 Scoops by mouth daily. Beet Powder supplement   OVER THE COUNTER MEDICATION Take 2 tablets by mouth daily. 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[employer]  Tobacco Use   Smoking status: Never   Smokeless tobacco: Never  Vaping Use   Vaping status: Never Used  Substance and Sexual Activity   Alcohol use: Yes    Alcohol/week: 0.0 standard drinks of alcohol    Comment: enjoys red wine   Drug use: Not on file   Sexual activity: Not on file  Other Topics Concern   Not on file  Social History Narrative   Not on file   Social Drivers of Health   Financial Resource Strain: Not on file  Food Insecurity: Not on file  Transportation Needs: Not on file  Physical Activity: Not on file  Stress: Not on file  Social Connections: Unknown (02/10/2022)   Received from [facility]   Social Network    Social Network: Not on file     Family History: The patient's family history includes Alzheimer's disease in his mother; Dementia in his mother; Heart disease in his maternal grandfather and mother; Hypertension in his maternal grandfather, maternal grandmother,  and mother. There is no history of Diabetes or Cancer.  ROS:   Please see the history of present illness.     All other systems reviewed and are negative.  EKGs/Labs/Other Studies Reviewed:    The following studies were reviewed today:   EKG Interpretation Date/Time:  Thursday November 25 2023 10:07:18 EST Ventricular Rate:  109 PR Interval:  162 QRS Duration:  86 QT Interval:  334 QTC Calculation: 449 R Axis:   -59  Text Interpretation: Sinus tachycardia Pulmonary disease pattern Left anterior fascicular block Confirmed by Debbe Odea ([id number]) on 11/25/2023 10:33:11 AM    Recent Labs: No results found for requested labs within  last 365 days.  Recent Lipid Panel    Component Value Date/Time   CHOL 193 12/28/2022 0814   CHOL 268 (H) 01/19/2022 1033   TRIG 73 12/28/2022 0814   HDL 94 12/28/2022 0814   HDL 88 01/19/2022 1033   CHOLHDL 2.1 12/28/2022 0814   VLDL 15 12/28/2022 0814   LDLCALC 84 12/28/2022 0814   LDLCALC 165 (H) 01/19/2022 1033   LDLDIRECT 150.7 06/25/2010 1233     Risk Assessment/Calculations:          Physical Exam:    VS:  BP (!) 172/82 (BP Location: Left Arm, Patient Position: Sitting, Cuff Size: Normal) Comment: Pt looks nervious.  Pulse (!) 109   Ht 5\' 9"  (1.753 m)   Wt 187 lb (84.8 kg)   SpO2 94%   BMI 27.62 kg/m     Wt Readings from Last 3 Encounters:  11/25/23 187 lb (84.8 kg)  01/07/23 183 lb 3.2 oz (83.1 kg)  11/11/22 185 lb (83.9 kg)     GEN:  Well nourished, well developed in no acute distress HEENT: Normal NECK: No JVD; No carotid bruits CARDIAC: RRR, no murmurs, rubs, gallops RESPIRATORY:  Clear to auscultation without rales, wheezing or rhonchi  ABDOMEN: Soft, non-tender, non-distended MUSCULOSKELETAL:  No edema; No deformity  SKIN: Warm and dry NEUROLOGIC:  Alert and oriented x 3 PSYCHIATRIC:  Normal affect   ASSESSMENT:    1. White coat syndrome with diagnosis of hypertension   2. Pure hypercholesterolemia   3. Ascending aorta dilation (HCC)    PLAN:    In order of problems listed above:  Hypertension, BP elevated.  He has a component of whitecoat syndrome, BP controlled at home.  Continue HCTZ 25 mg daily, losartan 100 mg daily, Norvasc 10 mg daily. Hyperlipidemia, cholesterol controlled.  Continue Lipitor 40 mg daily.   Mild ascending aorta dilatation measuring 43 mm, repeat echo 12/2022 with stable ascending aorta dilatation measuring 43 mm.  Repeat echo in 1 year which will be 2 years from last echo.  Follow-up in 12 months.   Medication Adjustments/Labs and Tests Ordered: Current medicines are reviewed at length with the patient today.   Concerns regarding medicines are outlined above.  Orders Placed This Encounter  Procedures   EKG 12-Lead   ECHOCARDIOGRAM COMPLETE     No orders of the defined types were placed in this encounter.     Patient Instructions  Medication Instructions:  - Your physician recommends that you continue on your current medications as directed. Please refer to the Current Medication list given to you today.  *If you need a refill on your cardiac medications before your next appointment, please call your pharmacy*   Lab Work: - none ordered  If you have labs (blood work) drawn today and your tests are completely normal, you will receive  your results only by: MyChart Message (if you have MyChart) OR A paper copy in the mail If you have any lab test that is abnormal or we need to change your treatment, we will call you to review the results.   Testing/Procedures: 1) Echocardiogram: in 1 year  -Your physician has requested that you have an echocardiogram. Echocardiography is a painless test that uses sound waves to create images of your heart. It provides your doctor with information about the size and shape of your heart and how well your heart's chambers and valves are working. This procedure takes approximately one hour. There are no restrictions for this procedure. Please do NOT wear cologne, perfume, aftershave, or lotions (deodorant is allowed). Please arrive 15 minutes prior to your appointment time.  There is a possibility that an IV may need to be started during your test to inject an image enhancing agent. This is done to obtain more optimal pictures of your heart. Therefore we ask that you do at least drink some water prior to coming in to hydrate your veins.    Please note: We ask at that you not bring children with you during ultrasound (echo/ vascular) testing. Due to room size and safety concerns, children are not allowed in the ultrasound rooms during exams. Our front office  staff cannot provide observation of children in our lobby area while testing is being conducted. An adult accompanying a patient to their appointment will only be allowed in the ultrasound room at the discretion of the ultrasound technician under special circumstances. We apologize for any inconvenience.    Follow-Up: At [facility], you and your health needs are our priority.  As part of our continuing mission to provide you with exceptional heart care, we have created designated Provider Care Teams.  These Care Teams include your primary Cardiologist (physician) and Advanced Practice Providers (APPs -  Physician Assistants and Nurse Practitioners) who all work together to provide you with the care you need, when you need it.  We recommend signing up for the patient portal called "MyChart".  Sign up information is provided on this After Visit Summary.  MyChart is used to connect with patients for Virtual Visits (Telemedicine).  Patients are able to view lab/test results, encounter notes, upcoming appointments, etc.  Non-urgent messages can be sent to your provider as well.   To learn more about what you can do with MyChart, go to [email or web address].    Your next appointment:   1 year(s)/ after testing is completed  Provider:   You may see Debbe Odea, MD or one of the following Advanced Practice Providers on your designated Care Team:   Nicolasa Ducking, NP Eula Listen, PA-C Cadence Fransico Michael, PA-C Charlsie Quest, NP Carlos Levering, NP    Other Instructions Echocardiogram An echocardiogram is a test that uses sound waves to make images of your heart. This way of making images is often called ultrasound. The images from this test can help find out many things about your heart, including: The size and shape of your heart. The strength of your heart muscle and how well it's working. The size, thickness, and movement of your heart's walls. How your heart valves are  working. Problems such as: A tumor or a growth from an infection around the heart valves. Areas of heart muscle that aren't working well because of poor blood flow or injury from a heart attack. An aneurysm. This is a weak or damaged part of an artery wall. An artery  is a blood vessel. Tell a health care provider about: Any allergies you have. All medicines you're taking, including vitamins, herbs, eye drops, creams, and over-the-counter medicines. Any bleeding problems you have. Any surgeries you've had. Any medical problems you have. Whether you're pregnant or may be pregnant. What are the risks? Your health care provider will talk with you about risks. These may include an allergic reaction to IV dye that may be used during the test. What happens before the test? You don't need to do anything to get ready for this test. You may eat and drink normally. What happens during the test?  You'll take off your clothes from the waist up and put on a hospital gown. Sticky patches called electrodes may be placed on your chest. These will be connected to a machine that monitors your heart rate and rhythm. You'll lie down on a table for the exam. A wand covered in gel will be moved over your chest. Sound waves from the wand will go to your heart and bounce back--or "echo" back. The sound waves will go to a computer that uses them to make images of your heart. The images can be viewed on a monitor. The images will also be recorded on the computer so your provider can look at them later. You may be asked to change positions or hold your breath for a short time. This makes it easier to get different views or better views of your heart. In some cases, you may be given a dye through an IV. The IV is put into one of your veins. This dye can make the areas of your heart easier to see. The procedure may vary among providers and hospitals. What can I expect after the test? You may return to your normal diet,  activities, and medicines unless your provider tells you not to. If an IV was placed for the test, it will be removed. It's up to you to get the results of your test. Ask your provider, or the department that's doing the test, when your results will be ready. This information is not intended to replace advice given to you by your health care provider. Make sure you discuss any questions you have with your health care provider. Document Revised: 11/13/2022 Document Reviewed: 11/13/2022 Elsevier Patient Education  2024 Else[employer] Inc.       Signed, Debbe Odea, MD  11/25/2023 10:59 AM    [facility]

## 2023-11-25 NOTE — Patient Instructions (Signed)
 Medication Instructions:  - Your physician recommends that you continue on your current medications as directed. Please refer to the Current Medication list given to you today.  *If you need a refill on your cardiac medications before your next appointment, please call your pharmacy*   Lab Work: - none ordered  If you have labs (blood work) drawn today and your tests are completely normal, you will receive your results only by: MyChart Message (if you have MyChart) OR A paper copy in the mail If you have any lab test that is abnormal or we need to change your treatment, we will call you to review the results.   Testing/Procedures: 1) Echocardiogram: in 1 year  -Your physician has requested that you have an echocardiogram. Echocardiography is a painless test that uses sound waves to create images of your heart. It provides your doctor with information about the size and shape of your heart and how well your heart's chambers and valves are working. This procedure takes approximately one hour. There are no restrictions for this procedure. Please do NOT wear cologne, perfume, aftershave, or lotions (deodorant is allowed). Please arrive 15 minutes prior to your appointment time.  There is a possibility that an IV may need to be started during your test to inject an image enhancing agent. This is done to obtain more optimal pictures of your heart. Therefore we ask that you do at least drink some water prior to coming in to hydrate your veins.    Please note: We ask at that you not bring children with you during ultrasound (echo/ vascular) testing. Due to room size and safety concerns, children are not allowed in the ultrasound rooms during exams. Our front office staff cannot provide observation of children in our lobby area while testing is being conducted. An adult accompanying a patient to their appointment will only be allowed in the ultrasound room at the discretion of the ultrasound  technician under special circumstances. We apologize for any inconvenience.    Follow-Up: At [facility], you and your health needs are our priority.  As part of our continuing mission to provide you with exceptional heart care, we have created designated Provider Care Teams.  These Care Teams include your primary Cardiologist (physician) and Advanced Practice Providers (APPs -  Physician Assistants and Nurse Practitioners) who all work together to provide you with the care you need, when you need it.  We recommend signing up for the patient portal called "MyChart".  Sign up information is provided on this After Visit Summary.  MyChart is used to connect with patients for Virtual Visits (Telemedicine).  Patients are able to view lab/test results, encounter notes, upcoming appointments, etc.  Non-urgent messages can be sent to your provider as well.   To learn more about what you can do with MyChart, go to [email or web address].    Your next appointment:   1 year(s)/ after testing is completed  Provider:   You may see Debbe Odea, MD or one of the following Advanced Practice Providers on your designated Care Team:   Nicolasa Ducking, NP Eula Listen, PA-C Cadence Fransico Michael, PA-C Charlsie Quest, NP Carlos Levering, NP    Other Instructions Echocardiogram An echocardiogram is a test that uses sound waves to make images of your heart. This way of making images is often called ultrasound. The images from this test can help find out many things about your heart, including: The size and shape of your heart. The strength of your heart muscle  and how well it's working. The size, thickness, and movement of your heart's walls. How your heart valves are working. Problems such as: A tumor or a growth from an infection around the heart valves. Areas of heart muscle that aren't working well because of poor blood flow or injury from a heart attack. An aneurysm. This is a weak or damaged  part of an artery wall. An artery is a blood vessel. Tell a health care provider about: Any allergies you have. All medicines you're taking, including vitamins, herbs, eye drops, creams, and over-the-counter medicines. Any bleeding problems you have. Any surgeries you've had. Any medical problems you have. Whether you're pregnant or may be pregnant. What are the risks? Your health care provider will talk with you about risks. These may include an allergic reaction to IV dye that may be used during the test. What happens before the test? You don't need to do anything to get ready for this test. You may eat and drink normally. What happens during the test?  You'll take off your clothes from the waist up and put on a hospital gown. Sticky patches called electrodes may be placed on your chest. These will be connected to a machine that monitors your heart rate and rhythm. You'll lie down on a table for the exam. A wand covered in gel will be moved over your chest. Sound waves from the wand will go to your heart and bounce back--or "echo" back. The sound waves will go to a computer that uses them to make images of your heart. The images can be viewed on a monitor. The images will also be recorded on the computer so your provider can look at them later. You may be asked to change positions or hold your breath for a short time. This makes it easier to get different views or better views of your heart. In some cases, you may be given a dye through an IV. The IV is put into one of your veins. This dye can make the areas of your heart easier to see. The procedure may vary among providers and hospitals. What can I expect after the test? You may return to your normal diet, activities, and medicines unless your provider tells you not to. If an IV was placed for the test, it will be removed. It's up to you to get the results of your test. Ask your provider, or the department that's doing the test, when  your results will be ready. This information is not intended to replace advice given to you by your health care provider. Make sure you discuss any questions you have with your health care provider. Document Revised: 11/13/2022 Document Reviewed: 11/13/2022 Elsevier Patient Education  2024 [employer].

## 2023-12-07 DIAGNOSIS — C4401 Basal cell carcinoma of skin of lip: Secondary | ICD-10-CM | POA: Diagnosis not present

## 2023-12-16 ENCOUNTER — Other Ambulatory Visit: Payer: Self-pay

## 2023-12-16 MED ORDER — HYDROCHLOROTHIAZIDE 25 MG PO TABS
25.0000 mg | ORAL_TABLET | Freq: Every day | ORAL | 11 refills | Status: DC
Start: 2023-12-16 — End: 2023-12-22

## 2023-12-16 MED ORDER — AMLODIPINE BESYLATE 10 MG PO TABS: 10.0000 mg | ORAL_TABLET | Freq: Every day | ORAL | 11 refills | Status: DC

## 2023-12-16 NOTE — Addendum Note (Signed)
 Addended by: Auburn Bilberry D on: 12/16/2023 01:54 PM   Modules accepted: Orders

## 2023-12-21 ENCOUNTER — Other Ambulatory Visit: Payer: Self-pay

## 2023-12-21 DIAGNOSIS — I10 Essential (primary) hypertension: Secondary | ICD-10-CM

## 2023-12-21 MED ORDER — LOSARTAN POTASSIUM 100 MG PO TABS: 100.0000 mg | ORAL_TABLET | Freq: Two times a day (BID) | ORAL | 11 refills | Status: AC

## 2023-12-21 MED ORDER — ATORVASTATIN CALCIUM 40 MG PO TABS: 40.0000 mg | ORAL_TABLET | Freq: Every day | ORAL | 11 refills | Status: AC

## 2023-12-21 NOTE — Addendum Note (Signed)
 Addended by: Auburn Bilberry D on: 12/21/2023 10:43 AM   Modules accepted: Orders

## 2023-12-22 ENCOUNTER — Other Ambulatory Visit: Payer: Self-pay

## 2023-12-22 MED ORDER — HYDROCHLOROTHIAZIDE 25 MG PO TABS: 25.0000 mg | ORAL_TABLET | Freq: Every day | ORAL | 5 refills | Status: AC

## 2023-12-27 ENCOUNTER — Other Ambulatory Visit: Payer: Self-pay

## 2023-12-27 MED ORDER — AMLODIPINE BESYLATE 10 MG PO TABS: 10.0000 mg | ORAL_TABLET | Freq: Every day | ORAL | 11 refills | Status: AC

## 2024-01-19 DIAGNOSIS — C44319 Basal cell carcinoma of skin of other parts of face: Secondary | ICD-10-CM | POA: Diagnosis not present

## 2024-01-19 DIAGNOSIS — C44311 Basal cell carcinoma of skin of nose: Secondary | ICD-10-CM | POA: Diagnosis not present

## 2024-02-10 DIAGNOSIS — Z0110 Encounter for examination of ears and hearing without abnormal findings: Secondary | ICD-10-CM | POA: Diagnosis not present

## 2024-02-10 DIAGNOSIS — H9193 Unspecified hearing loss, bilateral: Secondary | ICD-10-CM | POA: Diagnosis not present

## 2024-02-10 DIAGNOSIS — Z77122 Contact with and (suspected) exposure to noise: Secondary | ICD-10-CM | POA: Diagnosis not present

## 2024-02-10 DIAGNOSIS — H9313 Tinnitus, bilateral: Secondary | ICD-10-CM | POA: Diagnosis not present

## 2024-05-01 DIAGNOSIS — L905 Scar conditions and fibrosis of skin: Secondary | ICD-10-CM | POA: Diagnosis not present

## 2024-05-01 DIAGNOSIS — C44519 Basal cell carcinoma of skin of other part of trunk: Secondary | ICD-10-CM | POA: Diagnosis not present

## 2024-05-01 DIAGNOSIS — L565 Disseminated superficial actinic porokeratosis (DSAP): Secondary | ICD-10-CM | POA: Diagnosis not present

## 2024-05-01 DIAGNOSIS — L821 Other seborrheic keratosis: Secondary | ICD-10-CM | POA: Diagnosis not present

## 2024-05-01 DIAGNOSIS — Z85828 Personal history of other malignant neoplasm of skin: Secondary | ICD-10-CM | POA: Diagnosis not present

## 2024-05-01 DIAGNOSIS — Z85820 Personal history of malignant melanoma of skin: Secondary | ICD-10-CM | POA: Diagnosis not present

## 2024-05-01 DIAGNOSIS — L570 Actinic keratosis: Secondary | ICD-10-CM | POA: Diagnosis not present

## 2024-11-24 ENCOUNTER — Ambulatory Visit
